# Patient Record
Sex: Female | Born: 2000 | Race: Black or African American | Hispanic: No | Marital: Single | Smoking: Never smoker
Health system: Southern US, Community
[De-identification: ages and names within clinical notes are randomized; demographics above are authoritative.]

## PROBLEM LIST (undated history)

## (undated) DIAGNOSIS — Z87898 Personal history of other specified conditions: Secondary | ICD-10-CM

## (undated) DIAGNOSIS — G919 Hydrocephalus, unspecified: Secondary | ICD-10-CM

## (undated) DIAGNOSIS — I615 Nontraumatic intracerebral hemorrhage, intraventricular: Secondary | ICD-10-CM

## (undated) DIAGNOSIS — J984 Other disorders of lung: Secondary | ICD-10-CM

## (undated) DIAGNOSIS — Z982 Presence of cerebrospinal fluid drainage device: Secondary | ICD-10-CM

## (undated) DIAGNOSIS — I639 Cerebral infarction, unspecified: Secondary | ICD-10-CM

## (undated) DIAGNOSIS — H35109 Retinopathy of prematurity, unspecified, unspecified eye: Secondary | ICD-10-CM

## (undated) HISTORY — PX: VENTRICULO-PERITONEAL SHUNT PLACEMENT / LAPAROSCOPIC INSERTION PERITONEAL CATHETER: SUR1040

## (undated) HISTORY — DX: Personal history of other specified conditions: Z87.898

## (undated) HISTORY — PX: UMBILICAL HERNIA REPAIR: SHX196

## (undated) HISTORY — PX: LAPAROSCOPIC REVISION VENTRICULAR-PERITONEAL (V-P) SHUNT: SHX5924

## (undated) HISTORY — DX: Other disorders of lung: J98.4

## (undated) HISTORY — PX: EYE SURGERY: SHX253

## (undated) HISTORY — PX: OVARY SURGERY: SHX727

---
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
## 2022-06-22 ENCOUNTER — Emergency Department (HOSPITAL_COMMUNITY): Payer: Medicaid Other

## 2022-06-22 ENCOUNTER — Encounter (HOSPITAL_COMMUNITY): Payer: Self-pay

## 2022-06-22 ENCOUNTER — Other Ambulatory Visit: Payer: Self-pay

## 2022-06-22 ENCOUNTER — Emergency Department (HOSPITAL_COMMUNITY)
Admission: EM | Admit: 2022-06-22 | Discharge: 2022-06-22 | Disposition: A | Discharge status: Home / Self Care | Payer: Medicaid Other | Attending: Emergency Medicine | Admitting: Emergency Medicine

## 2022-06-22 DIAGNOSIS — R1013 Epigastric pain: Secondary | ICD-10-CM | POA: Insufficient documentation

## 2022-06-22 DIAGNOSIS — R112 Nausea with vomiting, unspecified: Secondary | ICD-10-CM | POA: Diagnosis not present

## 2022-06-22 DIAGNOSIS — E860 Dehydration: Secondary | ICD-10-CM | POA: Diagnosis not present

## 2022-06-22 DIAGNOSIS — R519 Headache, unspecified: Secondary | ICD-10-CM

## 2022-06-22 LAB — CBC WITH DIFFERENTIAL/PLATELET
Abs Immature Granulocytes: 0.05 10*3/uL (ref 0.00–0.07)
Basophils Absolute: 0 10*3/uL (ref 0.0–0.1)
Basophils Relative: 0 %
Eosinophils Absolute: 0 10*3/uL (ref 0.0–0.5)
Eosinophils Relative: 0 %
HCT: 39.5 % (ref 36.0–46.0)
Hemoglobin: 13.6 g/dL (ref 12.0–15.0)
Immature Granulocytes: 0 %
Lymphocytes Relative: 8 %
Lymphs Abs: 1.2 10*3/uL (ref 0.7–4.0)
MCH: 29.3 pg (ref 26.0–34.0)
MCHC: 34.4 g/dL (ref 30.0–36.0)
MCV: 85.1 fL (ref 80.0–100.0)
Monocytes Absolute: 0.3 10*3/uL (ref 0.1–1.0)
Monocytes Relative: 2 %
Neutro Abs: 13.1 10*3/uL — ABNORMAL HIGH (ref 1.7–7.7)
Neutrophils Relative %: 90 %
Platelets: 234 10*3/uL (ref 150–400)
RBC: 4.64 MIL/uL (ref 3.87–5.11)
RDW: 12.9 % (ref 11.5–15.5)
WBC: 14.6 10*3/uL — ABNORMAL HIGH (ref 4.0–10.5)
nRBC: 0 % (ref 0.0–0.2)

## 2022-06-22 LAB — BASIC METABOLIC PANEL
Anion gap: 13 (ref 5–15)
BUN: 15 mg/dL (ref 6–20)
CO2: 20 mmol/L — ABNORMAL LOW (ref 22–32)
Calcium: 10.2 mg/dL (ref 8.9–10.3)
Chloride: 104 mmol/L (ref 98–111)
Creatinine, Ser: 1.02 mg/dL — ABNORMAL HIGH (ref 0.44–1.00)
GFR, Estimated: >60 mL/min (ref >60)
Glucose, Bld: 120 mg/dL — ABNORMAL HIGH (ref 70–99)
Potassium: 3.9 mmol/L (ref 3.5–5.1)
Sodium: 137 mmol/L (ref 135–145)

## 2022-06-22 LAB — I-STAT BETA HCG BLOOD, ED (MC, WL, AP ONLY): I-stat hCG, quantitative: <5 m[IU]/mL (ref <5)

## 2022-06-22 MED ORDER — ONDANSETRON HCL 4 MG/2ML IJ SOLN
4.0000 mg | Freq: Once | INTRAMUSCULAR | Status: AC
  Administered 2022-06-22: 4 mg via INTRAVENOUS
  Filled 2022-06-22: qty 2

## 2022-06-22 MED ORDER — LACTATED RINGERS IV BOLUS
1000.0000 mL | Freq: Once | INTRAVENOUS | Status: AC
  Administered 2022-06-22: 1000 mL via INTRAVENOUS

## 2022-06-22 MED ORDER — ONDANSETRON HCL 4 MG PO TABS: 4.0000 mg | ORAL_TABLET | Freq: Four times a day (QID) | ORAL | 0 refills | Status: DC

## 2022-06-22 MED ORDER — METOCLOPRAMIDE HCL 5 MG/ML IJ SOLN
10.0000 mg | Freq: Once | INTRAMUSCULAR | Status: AC
  Administered 2022-06-22: 10 mg via INTRAVENOUS
  Filled 2022-06-22: qty 2

## 2022-06-22 NOTE — ED Notes (Signed)
Pt transported to XR.  

## 2022-06-22 NOTE — ED Provider Notes (Signed)
[place] EMERGENCY DEPARTMENT Provider Note   CSN: [id number] Arrival date & time: 06/22/22  1655     History  Chief Complaint  Patient presents with   shunt malfunction    Crystal Greene is a 21 y.o. female.  Pt is a 21y/o female with history of hydrocephalus s/p shunt revision on 12/20/13 with a [employer] valve at 140 mm who has been doing well until about 2 hours prior to arrival.  She states she suddenly started feeling a sensation of pins-and-needles across her forehead, 2 episodes of vomiting and upper abdominal pain.  She has not had fever but mom reports she seems more sleepy than normal and this is the way she acted the last time she had a shunt malfunction.  Pt denies fever.  No numbness or tingling in the ext.  Pt denies any lower abd pain, dysuria or pelvic symptoms.  Pt reports feeling normal when she woke up this morning.  The history is provided by the patient, the EMS personnel and a parent.       Home Medications Prior to Admission medications   Medication Sig Start Date End Date Taking? Authorizing Provider  ondansetron (ZOFRAN) 4 MG tablet Take 1 tablet (4 mg total) by mouth every 6 (six) hours. 06/22/22  Yes Toba Claudio, Loree Fee, MD  albuterol (PROAIR HFA) 108 (90 Base) MCG/ACT inhaler Inhale 2 puffs into the lungs every 4 (four) hours as needed for wheezing or shortness of breath. Patient not taking: Reported on 11/10/2016 03/24/16   Bobbitt, Sedalia Muta, MD  cetirizine (ZYRTEC) 10 MG tablet Take 10 mg by mouth daily. as directed 06/07/16   [provider]  clindamycin-benzoyl peroxide (BENZACLIN) gel Apply topically daily. 09/25/16   Guerry Minors, MD  desonide (DESOWEN) 0.05 % ointment APPLY TO RED RASH AREAS AT [facility] TWICE DAILY AS NEEDED. 08/22/16   Bobbitt, Sedalia Muta, MD  diphenhydrAMINE (BENADRYL) 25 mg capsule Take 1 capsule (25 mg total) by mouth every 6 (six) hours as needed. 06/13/16   Jola Schmidt, MD   EPINEPHrine (EPIPEN 2-PAK) 0.3 mg/0.3 mL IJ SOAJ injection INJECT 0.3MLS INTO THE MUSCLE ONCE in case of anaphylaxis 03/24/16   Bobbitt, Sedalia Muta, MD  famotidine (PEPCID) 20 MG tablet Take 1 tablet (20 mg total) by mouth 2 (two) times daily. Patient not taking: Reported on 09/05/2016 06/13/16   Jola Schmidt, MD  levocetirizine Harlow Ohms) 5 MG tablet Take one tablet daily for runny nose or itching. 03/24/16   Bobbitt, Sedalia Muta, MD  mometasone (NASONEX) 50 MCG/ACT nasal spray Use one spray in each nostril 1-2 times daily as needed for stuffy nose or drainage. 03/24/16   Bobbitt, Sedalia Muta, MD  ranitidine (ZANTAC) 150 MG tablet TAKE ONE TABLET ONCE OR TWICE DAILY AS DIRECTED 01/01/16   Bobbitt, Sedalia Muta, MD  triamcinolone (KENALOG) 0.025 % ointment Apply to red rash areas at body twice daily as needed.  DO NOT APPLY TO FACE. 03/24/16   Bobbitt, Sedalia Muta, MD  triamcinolone ointment (KENALOG) 0.1 % Apply small amount to affected areas BID for eczema flares.  Use up to 2 weeks at a time 07/14/16   Ander Slade, NP      Allergies    Shellfish allergy, Fish allergy, and Other    Review of Systems   Review of Systems  Physical Exam Updated Vital Signs BP 132/89   Pulse 72   Temp (!) 97.3 F (36.3 C) (Oral)   Resp (!) 30   SpO2 99%  Physical Exam Vitals and nursing note reviewed.  Constitutional:      General: She is not in acute distress.    Appearance: She is well-developed.  HENT:     Head: Normocephalic and atraumatic.  Eyes:     Pupils: Pupils are equal, round, and reactive to light.  Neck:     Comments: No tenderness noted over shunt tubing Cardiovascular:     Rate and Rhythm: Normal rate and regular rhythm.     Heart sounds: Normal heart sounds. No murmur heard.    No friction rub.  Pulmonary:     Effort: Pulmonary effort is normal.     Breath sounds: Normal breath sounds. No wheezing or rales.  Abdominal:     General: Bowel sounds are normal. There is no  distension.     Palpations: Abdomen is soft.     Tenderness: There is abdominal tenderness in the epigastric area. There is no guarding or rebound.  Musculoskeletal:        General: No tenderness. Normal range of motion.     Comments: No edema  Skin:    General: Skin is warm and dry.     Findings: No rash.  Neurological:     Mental Status: She is alert.     Cranial Nerves: No cranial nerve deficit.     Comments: Pt is oriented x3.  She is awake but slightly slower to respond.  Pt is moving all ext with 5/5 strength and sensation.  No pronator drift.  Pupils are reactive.  Psychiatric:        Behavior: Behavior normal.     ED Results / Procedures / Treatments   Labs (all labs ordered are listed, but only abnormal results are displayed) Labs Reviewed  CBC WITH DIFFERENTIAL/PLATELET - Abnormal; Notable for the following components:      Result Value   WBC 14.6 (*)    Neutro Abs 13.1 (*)    All other components within normal limits  BASIC METABOLIC PANEL - Abnormal; Notable for the following components:   CO2 20 (*)    Glucose, Bld 120 (*)    Creatinine, Ser 1.02 (*)    All other components within normal limits  I-STAT BETA HCG BLOOD, ED ([facility], [facility], [facility] ONLY)    EKG None  Radiology DG Cervical Spine 1 View  Result Date: 06/22/2022 CLINICAL DATA:  Shunt malfunction EXAM: DG CERVICAL SPINE - 1 VIEW; ABDOMEN - 1 VIEW; CHEST 1 VIEW; SKULL - 1-3 VIEW COMPARISON:  None Available. FINDINGS: Left frontal ventriculostomy is in place with its tip oriented toward the medial aspect of the left lateral ventricle anteriorly. Shunt catheter tubing is seen along its left cervical, thoracic, and intra-abdominal course, accessing the abdomen within the epigastrium with its tip located within the left upper quadrant of the abdomen. The catheter is intact along its course. Lungs are clear. Cardiac size within normal limits. Pulmonary vascularity is normal. Normal abdominal gas pattern. IMPRESSION: 1.  Intact shunt catheter tubing. 2. No acute cardiopulmonary disease. 3. Normal abdominal gas pattern. Electronically Signed   By: Helyn Numbers M.D.   On: 06/22/2022 19:02   DG Skull 1-3 Views  Result Date: 06/22/2022 CLINICAL DATA:  Shunt malfunction EXAM: DG CERVICAL SPINE - 1 VIEW; ABDOMEN - 1 VIEW; CHEST 1 VIEW; SKULL - 1-3 VIEW COMPARISON:  None Available. FINDINGS: Left frontal ventriculostomy is in place with its tip oriented toward the medial aspect of the left lateral ventricle anteriorly. Shunt catheter tubing is  seen along its left cervical, thoracic, and intra-abdominal course, accessing the abdomen within the epigastrium with its tip located within the left upper quadrant of the abdomen. The catheter is intact along its course. Lungs are clear. Cardiac size within normal limits. Pulmonary vascularity is normal. Normal abdominal gas pattern. IMPRESSION: 1. Intact shunt catheter tubing. 2. No acute cardiopulmonary disease. 3. Normal abdominal gas pattern. Electronically Signed   By: Helyn Numbers M.D.   On: 06/22/2022 19:02   DG Chest 1 View  Result Date: 06/22/2022 CLINICAL DATA:  Shunt malfunction EXAM: DG CERVICAL SPINE - 1 VIEW; ABDOMEN - 1 VIEW; CHEST 1 VIEW; SKULL - 1-3 VIEW COMPARISON:  None Available. FINDINGS: Left frontal ventriculostomy is in place with its tip oriented toward the medial aspect of the left lateral ventricle anteriorly. Shunt catheter tubing is seen along its left cervical, thoracic, and intra-abdominal course, accessing the abdomen within the epigastrium with its tip located within the left upper quadrant of the abdomen. The catheter is intact along its course. Lungs are clear. Cardiac size within normal limits. Pulmonary vascularity is normal. Normal abdominal gas pattern. IMPRESSION: 1. Intact shunt catheter tubing. 2. No acute cardiopulmonary disease. 3. Normal abdominal gas pattern. Electronically Signed   By: Helyn Numbers M.D.   On: 06/22/2022 19:02   DG Abd  1 View  Result Date: 06/22/2022 CLINICAL DATA:  Shunt malfunction EXAM: DG CERVICAL SPINE - 1 VIEW; ABDOMEN - 1 VIEW; CHEST 1 VIEW; SKULL - 1-3 VIEW COMPARISON:  None Available. FINDINGS: Left frontal ventriculostomy is in place with its tip oriented toward the medial aspect of the left lateral ventricle anteriorly. Shunt catheter tubing is seen along its left cervical, thoracic, and intra-abdominal course, accessing the abdomen within the epigastrium with its tip located within the left upper quadrant of the abdomen. The catheter is intact along its course. Lungs are clear. Cardiac size within normal limits. Pulmonary vascularity is normal. Normal abdominal gas pattern. IMPRESSION: 1. Intact shunt catheter tubing. 2. No acute cardiopulmonary disease. 3. Normal abdominal gas pattern. Electronically Signed   By: Helyn Numbers M.D.   On: 06/22/2022 19:02   CT Head Wo Contrast  Result Date: 06/22/2022 CLINICAL DATA:  Headache, new or worsening neuro deficit. Patient reports sudden onset of headache nausea and abdominal pain. EXAM: CT HEAD WITHOUT CONTRAST TECHNIQUE: Contiguous axial images were obtained from the base of the skull through the vertex without intravenous contrast. RADIATION DOSE REDUCTION: This exam was performed according to the departmental dose-optimization program which includes automated exposure control, adjustment of the mA and/or kV according to patient size and/or use of iterative reconstruction technique. COMPARISON:  CT head without contrast 08/18/2014 FINDINGS: Brain: A left frontal ventriculostomy catheter is in stable position. Frontal horns are decompressed. Left lateral ventricle is decompressed. Mild prominence of the posterior horn of the right lateral ventricle noted, markedly improved from prior exam. Tip is within normal limits. No acute infarct, hemorrhage, or mass lesion is present. No significant white matter lesions are present. Deep brain nuclei are within normal limits.  The brainstem and cerebellum are within normal limits. Vascular: No hyperdense vessel or unexpected calcification. Skull: Right parietal burr hole noted. Left frontal burr hole is present catheter place. Skull is otherwise intact. No significant extracranial soft tissue lesion is present. Sinuses/Orbits: The paranasal sinuses and mastoid air cells are clear. The globes and orbits are within normal limits. IMPRESSION: 1. Stable position of left frontal ventriculostomy catheter. 2. Mild prominence of the posterior horn of  the right lateral ventricle, markedly improved from prior exam. This likely reflects patient's baseline without the parietal ventriculostomy catheter in place. Temporal tip is not dilated to suggest focal hydro. 3. No other acute intracranial abnormality. Electronically Signed   By: Marin Roberts M.D.   On: 06/22/2022 18:04    Procedures Procedures    Medications Ordered in ED Medications  metoCLOPramide (REGLAN) injection 10 mg (10 mg Intravenous Given 06/22/22 1807)  lactated ringers bolus 1,000 mL (0 mLs Intravenous Stopped 06/22/22 2112)    ED Course/ Medical Decision Making/ A&P                           Medical Decision Making Amount and/or Complexity of Data Reviewed Independent Historian: EMS External Data Reviewed: notes.    Details: [facility] Labs: ordered. Decision-making details documented in ED Course. Radiology: ordered and independent interpretation performed. Decision-making details documented in ED Course.  Risk Prescription drug management.   Pt with multiple medical problems and comorbidities and presenting today with a complaint that caries a high risk for morbidity and mortality.  Here today with sudden onset headache, vomiting and upper abdominal discomfort.  Patient was fine earlier today.  Patient is still lucid, answering questions appropriately but appears like she does not feel good.  She is slightly slower on response but no other  focal neurologic findings.  Concern for possible shunt malfunction and recurrent hydrocephalus versus shunt fracture.  Patient's last shunt was revised in 2015.  She reports no issues with it since that time.  Also concern for possible viral etiology, URI or subarachnoid hemorrhage as patient had sudden onset of symptoms.  She was given Reglan for headache and nausea.  Shunt series and head CT are pending.  Vital signs are pulse of 57 and blood pressure of 114 Systolic. 9:39 PM I have independently visualized and interpreted pt's images today. CT of the head with shunt in place and ventricle looks similar to prior.  Shunt series showed no sign of tubing breakage.  Radiology reports intact shunt catheter tubing in all views and stable position of left frontal ventriculostomy catheter.  There is mild prominence of the posterior horn of the right lateral ventricle markedly improved from prior exams and most likely at her baseline.  I independently interpreted patient's labs today and she has a leukocytosis of 14 but normal hemoglobin and platelet count, hCG is negative and BMP with mild AKI with creatinine of 1 today from her baseline of 0.7.  On repeat evaluation after Reglan patient reports her headache is much better and her nausea is improved.  Patient will receive 1 L of fluid and will ensure she can p.o. challenge.  Patient's family including her mom is at bedside.  Findings discussed with the patient and her family.  They are relieved that the shunt is functioning appropriately.  9:39 PM Patient tolerated p.o. challenge without difficulty.  She is ready for discharge home.        Final Clinical Impression(s) / ED Diagnoses Final diagnoses:  Acute intractable headache, unspecified headache type  Nausea and vomiting, unspecified vomiting type  Dehydration    Rx / DC Orders ED Discharge Orders          Ordered    ondansetron (ZOFRAN) 4 MG tablet  Every 6 hours        06/22/22 2138               Gwyneth Sprout, MD  06/22/22 2139  

## 2022-06-22 NOTE — ED Triage Notes (Signed)
Pt BIB [facility] from home after sudden onset of headache, nausea, and abd pain. Left VP shunt placed at [facility] as a child. Previous malfunction caused similar symptoms so family called. VSS.

## 2022-06-22 NOTE — Discharge Instructions (Signed)
Everything with your shunt looks normal.  You may have caught a bug.  You may have nausea over the next day or 2 and were given a prescription for nausea medicine.  However if you start running high fevers, the nausea is not controlled with the medication you start having any new weakness numbness, visual changes or confusion return to the emergency room.

## 2022-06-22 NOTE — ED Notes (Signed)
Patient drank apple juice with no nausea or vomiting. Patient resting at this time and fluids are still running.

## 2022-12-26 ENCOUNTER — Ambulatory Visit (INDEPENDENT_AMBULATORY_CARE_PROVIDER_SITE_OTHER): Payer: Medicaid Other

## 2022-12-26 VITALS — BP 121/74 | HR 74 | Ht 60.0 in | Wt 109.6 lb

## 2022-12-26 DIAGNOSIS — Z3009 Encounter for other general counseling and advice on contraception: Secondary | ICD-10-CM

## 2022-12-26 MED ORDER — IBUPROFEN 600 MG PO TABS: 600.0000 mg | ORAL_TABLET | Freq: Four times a day (QID) | ORAL | 0 refills | Status: AC | PRN

## 2022-12-26 MED ORDER — DIAZEPAM 5 MG PO TABS: 5.0000 mg | ORAL_TABLET | Freq: Once | ORAL | 0 refills | Status: AC

## 2022-12-26 MED ORDER — MISOPROSTOL 200 MCG PO TABS: ORAL_TABLET | ORAL | 0 refills | Status: DC

## 2022-12-26 NOTE — Progress Notes (Unsigned)
Pt presents for [facility] consult. Pt considering paragard. Unsure if she wants [facility] today.

## 2022-12-26 NOTE — Progress Notes (Unsigned)
   GYNECOLOGY OFFICE VISIT NOTE: BC CONSULT  History:  Crystal Greene is a 22 y.o. G0P0000 here today for [facility] consult.  atient states she is being instructed to start [facility] method by her mother, who is not present today.  She is not currently sexually active, but has been in the past. She endorses condom usage, but states "it was only one time in 2023."   She reports normal periods, but is unsure of duration or flow. She endorses "painful" cramping that causes "my back and stomach to be on fire."   She denies any abnormal vaginal discharge, bleeding, pelvic pain or other concerns.   Past Medical History:  Diagnosis Date   Chronic lung disease    H/O prematurity 05/19/2013   24 wks PPROM and vaginal delivery; originally twin gestation with pregnancy complicated by a single fetal demise.     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    Stroke [facility])     Past Surgical History:  Procedure Laterality Date   EYE SURGERY     LAPAROSCOPIC REVISION VENTRICULAR-PERITONEAL (V-P) SHUNT     OVARY SURGERY     UMBILICAL HERNIA REPAIR     VENTRICULO-PERITONEAL SHUNT PLACEMENT / LAPAROSCOPIC INSERTION PERITONEAL CATHETER      The following portions of the patient's history were reviewed and updated as appropriate: allergies, current medications, past family history, past medical history, past social history, past surgical history and problem list.   Health Maintenance:  No pap on file d/t age.  No mammogram on file d/t age.   Review of Systems:  Genito-Urinary ROS: no dysuria, trouble voiding, or hematuria Gastrointestinal ROS: negative Objective:  Vitals: Ht 5' (1.524 m)   Wt 109 lb 9.6 oz (49.7 kg)   LMP 12/23/2022   BMI 21.40 kg/m   Physical Exam: Physical Exam Constitutional:      Appearance: Normal appearance.  HENT:     Head: Normocephalic and atraumatic.  Eyes:     Conjunctiva/sclera:  Conjunctivae normal.  Cardiovascular:     Rate and Rhythm: Normal rate.  Musculoskeletal:        General: Normal range of motion.     Cervical back: Normal range of motion.  Neurological:     Mental Status: She is alert and oriented to person, place, and time.  Psychiatric:        Mood and Affect: Mood normal.        Behavior: Behavior normal.      Labs and Imaging: No results found.  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 -Extensively reviewed potential side effects of Paragard placement including but not limited to worsening cramps and heavier flow or duration. -Patient further informed that placement would not occur today as would recommend pretreatment with cervical ripening agent and medications. -Patient verbalizes understanding and states she would like to proceed with placement. -Plan to RTO in 2-3 weeks. -Rx for cytotec, valium, and ibuprofen sent to pharmacy on file.    Total face-to-face time with patient: 15 minutes   Gerrit Heck, [place] 12/26/2022 10:46 AM

## 2023-01-15 ENCOUNTER — Ambulatory Visit (INDEPENDENT_AMBULATORY_CARE_PROVIDER_SITE_OTHER): Payer: Medicaid Other

## 2023-01-15 VITALS — BP 106/74 | HR 110 | Ht 60.0 in | Wt 106.2 lb

## 2023-01-15 DIAGNOSIS — Z3009 Encounter for other general counseling and advice on contraception: Secondary | ICD-10-CM

## 2023-01-15 NOTE — Progress Notes (Signed)
Patient reports she did not take pre-procedural medication and would like to reschedule.  Instructed to send provider mychart message if medications not available.  Further instructed on administration of all medications the night before and day of procedure; inserted in patient phone per her request. Given informational phamplet for Mirena IUD.  Scheduled for Thursday May 23 at 1030 with this provider.  Cherre Robins MSN, CNM Advanced Practice Provider, [facility] [employer]

## 2023-01-15 NOTE — Progress Notes (Signed)
Pt presents for IUD insertion. Pt did not pick up Cytotec. Wants to try to do insertion today.

## 2023-01-22 ENCOUNTER — Ambulatory Visit (INDEPENDENT_AMBULATORY_CARE_PROVIDER_SITE_OTHER): Payer: Medicaid Other

## 2023-01-22 VITALS — BP 116/73 | HR 82 | Ht 60.0 in | Wt 105.3 lb

## 2023-01-22 DIAGNOSIS — Z30014 Encounter for initial prescription of intrauterine contraceptive device: Secondary | ICD-10-CM

## 2023-01-22 MED ORDER — PARAGARD INTRAUTERINE COPPER IU IUD
1.0000 | INTRAUTERINE_SYSTEM | Freq: Once | INTRAUTERINE | Status: AC
  Administered 2023-01-22: 1 via INTRAUTERINE

## 2023-01-22 NOTE — Addendum Note (Signed)
Addended by: Jearld Adjutant on: 01/22/2023 03:57 PM   Modules accepted: Orders

## 2023-01-22 NOTE — Progress Notes (Signed)
    GYNECOLOGY OFFICE PROCEDURE NOTE  Chenika Crysler is a 22 y.o. G0P0000 here for Paragard IUD insertion. No GYN concerns.  No pap smear was on file d/t age. Patient premedicated with of cytotec vaginal, ibuprofen, and valium.  Reports feeling calm and would like to proceed with insertion.   IUD Insertion Procedure Note IUD: Paragard  Exp: July 2029  Lot: See Mar  Patient identified, informed consent performed, consent signed.   Discussed risks of irregular bleeding, cramping, infection, malpositioning or misplacement of the IUD outside the uterus which may require further procedure such as laparoscopy. Time out was performed.  Urine pregnancy test negative.  Bimanual exam performed attempted, but patient reports discomfort. Short Pederson speculum placed in the vagina and cervix visualized.  Cervix and vaginal walls cleaned x 3 with betadine solution. Anterior aspect grasped with a single tooth tenaculum.  Uterus sounded to 7 cm.  Paragard IUD placed per manufacturer's recommendations via sterile procedure.  Attempting to trim strings and scissors snags string resulting in IUD expulsion. Sterile field maintained and IUD reloaded and reinserted without issues.   Strings trimmed to ~3 cm. Tenaculum was removed, good hemostasis noted. One Cytotec tablet noted in posterior fornix and removed with faux swab.  Patient tolerated procedure well.   Patient was given post-procedure instructions.  She was advised to have backup contraception for one week.  Patient was instructed to check IUD strings after heavy menses. Patient also instructed to follow up in 4 weeks for IUD check and call and/or report any issues prior to next visit.  Cherre Robins, CNM 01/22/2023

## 2023-01-29 ENCOUNTER — Encounter: Payer: Self-pay | Admitting: Family Medicine

## 2023-01-29 ENCOUNTER — Ambulatory Visit (INDEPENDENT_AMBULATORY_CARE_PROVIDER_SITE_OTHER): Payer: Medicaid Other | Admitting: Family Medicine

## 2023-01-29 VITALS — BP 90/62 | HR 85 | Temp 97.8°F | Ht 60.0 in | Wt 108.4 lb

## 2023-01-29 DIAGNOSIS — J4520 Mild intermittent asthma, uncomplicated: Secondary | ICD-10-CM

## 2023-01-29 DIAGNOSIS — T7800XA Anaphylactic reaction due to unspecified food, initial encounter: Secondary | ICD-10-CM

## 2023-01-29 NOTE — Progress Notes (Signed)
New Patient Office Visit  Subjective    Patient ID: Crystal Greene, female    DOB: 18-Sep-2000  Age: 22 y.o. MRN: [id number]  CC:  Chief Complaint  Patient presents with   Establish Care    HPI Crystal Greene presents to establish care Patient reports no new symptoms or issues today, patient reports a history of hydrocephalus in the past, sees Dr. Marice Potter at [facility] for this. States that she does have a history of headaches in the past, states that she has a history of prematurity and IVH, has a history of having a shunt placed as well.   Patient has a history of asthma in the past, doesn't really use her inhaler much, only if her allergies are bothering her. Also takes allergy medication for her allergies. Patient is allergic to shellfish, seafood, tree nuts. States she carries an epipen with her, hasn't had to use any in several years.   Patient did have an IUD placed, however no pap smear. She reports that she will not have a pap smear, does not want to have it done.   I have reviewed all aspects of the patient's medical history including social, family, and surgical history.  Current Outpatient Medications  Medication Instructions   albuterol (PROAIR HFA) 108 (90 Base) MCG/ACT inhaler 2 puffs, Inhalation, Every 4 hours PRN   EPINEPHrine (EPIPEN 2-PAK) 0.3 mg/0.3 mL IJ SOAJ injection INJECT 0.3MLS INTO THE MUSCLE ONCE in case of anaphylaxis   ibuprofen (ADVIL) 600 mg, Oral, Every 6 hours PRN    Past Medical History:  Diagnosis Date   Chronic lung disease    H/O prematurity 05/19/2013   24 wks PPROM and vaginal delivery; originally twin gestation with pregnancy complicated by a single fetal demise.     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    Stroke [facility])     Past Surgical History:  Procedure Laterality Date   EYE SURGERY     LAPAROSCOPIC REVISION VENTRICULAR-PERITONEAL (V-P) SHUNT      OVARY SURGERY     UMBILICAL HERNIA REPAIR     VENTRICULO-PERITONEAL SHUNT PLACEMENT / LAPAROSCOPIC INSERTION PERITONEAL CATHETER      Family History  Problem Relation Age of Onset   Other Mother        Alpha Thalassemia   Asthma Mother    Pneumonia Maternal Grandfather        died at age 15   High blood pressure Paternal Grandmother    Heart Problems Paternal Grandmother    Bleeding Disorder Paternal Grandmother    Heart attack Paternal Grandmother     Social History   Socioeconomic History   Marital status: Single    Spouse name: Not on file   Number of children: Not on file   Years of education: Not on file   Highest education level: Not on file  Occupational History   Not on file  Tobacco Use   Smoking status: Never   Smokeless tobacco: Never  Vaping Use   Vaping Use: Some days  Substance and Sexual Activity   Alcohol use: Not Currently   Drug use: Not Currently    Types: Marijuana   Sexual activity: Not Currently  Other Topics Concern   Not on file  Social History Narrative   Not on file   Social Determinants of Health   Financial Resource Strain: Not on file  Food  Insecurity: Not on file  Transportation Needs: Not on file  Physical Activity: Not on file  Stress: Not on file  Social Connections: Not on file  Intimate Partner Violence: Not on file    Review of Systems  All other systems reviewed and are negative.       Objective    BP 90/62 (BP Location: Right Arm, Patient Position: Sitting, Cuff Size: Normal)   Pulse 85   Temp 97.8 F (36.6 C) (Oral)   Ht 5' (1.524 m)   Wt 108 lb 6.4 oz (49.2 kg)   LMP  (LMP Unknown)   SpO2 97%   BMI 21.17 kg/m   Physical Exam Vitals reviewed.  Constitutional:      Appearance: Normal appearance. She is well-groomed and normal weight.  Eyes:     Conjunctiva/sclera: Conjunctivae normal.  Neck:     Thyroid: No thyromegaly.  Cardiovascular:     Rate and Rhythm: Normal rate and regular rhythm.      Pulses: Normal pulses.     Heart sounds: S1 normal and S2 normal.  Pulmonary:     Effort: Pulmonary effort is normal.     Breath sounds: Normal breath sounds and air entry.  Abdominal:     General: Bowel sounds are normal.  Musculoskeletal:     Right lower leg: No edema.     Left lower leg: No edema.  Neurological:     Mental Status: She is alert and oriented to person, place, and time. Mental status is at baseline.     Gait: Gait is intact.  Psychiatric:        Mood and Affect: Mood and affect normal.        Speech: Speech normal.        Behavior: Behavior normal.        Judgment: Judgment normal.         Assessment & Plan:  Mild intermittent asthma without complication Assessment & Plan: Lungs clear on exam, has albuterol rescue inhaler at home, she will continue 2 puffs every 6 hours as needed for wheezing.    Food allergy Assessment & Plan: Pt has epipens available at home, she will continue to avoid food triggers, use her epiens PRN allergic reaction. She will let me know when she needs a refill on her pens.    I spent 30 minutes with the patient today reviewing all of her history, discussing pap smear with her however she declines to have this done.   Return in about 1 year (around 01/29/2024) for annual physical exam.   Karie Georges, MD

## 2023-02-02 NOTE — Assessment & Plan Note (Signed)
Pt has epipens available at home, she will continue to avoid food triggers, use her epiens PRN allergic reaction. She will let me know when she needs a refill on her pens.

## 2023-02-02 NOTE — Assessment & Plan Note (Signed)
Lungs clear on exam, has albuterol rescue inhaler at home, she will continue 2 puffs every 6 hours as needed for wheezing.

## 2023-02-23 ENCOUNTER — Ambulatory Visit: Payer: Medicaid Other | Admitting: Advanced Practice Midwife

## 2023-02-25 ENCOUNTER — Ambulatory Visit: Payer: Medicaid Other | Admitting: Obstetrics and Gynecology

## 2024-08-09 ENCOUNTER — Ambulatory Visit: Admitting: Family Medicine

## 2024-08-09 ENCOUNTER — Encounter: Payer: Self-pay | Admitting: Family Medicine

## 2024-08-09 VITALS — BP 90/52 | HR 95 | Temp 98.4°F | Ht 60.0 in | Wt 107.7 lb

## 2024-08-09 DIAGNOSIS — T7800XD Anaphylactic reaction due to unspecified food, subsequent encounter: Secondary | ICD-10-CM

## 2024-08-09 DIAGNOSIS — Z982 Presence of cerebrospinal fluid drainage device: Secondary | ICD-10-CM

## 2024-08-09 DIAGNOSIS — K439 Ventral hernia without obstruction or gangrene: Secondary | ICD-10-CM

## 2024-08-09 DIAGNOSIS — R1085 Abdominal pain of multiple sites: Secondary | ICD-10-CM

## 2024-08-09 LAB — COMPREHENSIVE METABOLIC PANEL WITH GFR
ALT: 13 U/L (ref 0–35)
AST: 13 U/L (ref 0–37)
Albumin: 4.3 g/dL (ref 3.5–5.2)
Alkaline Phosphatase: 93 U/L (ref 39–117)
BUN: 9 mg/dL (ref 6–23)
CO2: 25 meq/L (ref 19–32)
Calcium: 9.6 mg/dL (ref 8.4–10.5)
Chloride: 101 meq/L (ref 96–112)
Creatinine, Ser: 0.67 mg/dL (ref 0.40–1.20)
GFR: 123.96 mL/min (ref >60.00)
Glucose, Bld: 74 mg/dL (ref 70–99)
Potassium: 4.1 meq/L (ref 3.5–5.1)
Sodium: 136 meq/L (ref 135–145)
Total Bilirubin: 0.5 mg/dL (ref 0.2–1.2)
Total Protein: 7.6 g/dL (ref 6.0–8.3)

## 2024-08-09 LAB — CBC WITH DIFFERENTIAL/PLATELET
Basophils Absolute: 0 K/uL (ref 0.0–0.1)
Basophils Relative: 0.2 % (ref 0.0–3.0)
Eosinophils Absolute: 0 K/uL (ref 0.0–0.7)
Eosinophils Relative: 0.3 % (ref 0.0–5.0)
HCT: 36.6 % (ref 36.0–46.0)
Hemoglobin: 12.1 g/dL (ref 12.0–15.0)
Lymphocytes Relative: 24.6 % (ref 12.0–46.0)
Lymphs Abs: 2.6 K/uL (ref 0.7–4.0)
MCHC: 33.1 g/dL (ref 30.0–36.0)
MCV: 83.2 fl (ref 78.0–100.0)
Monocytes Absolute: 0.7 K/uL (ref 0.1–1.0)
Monocytes Relative: 6.9 % (ref 3.0–12.0)
Neutro Abs: 7.2 K/uL (ref 1.4–7.7)
Neutrophils Relative %: 68 % (ref 43.0–77.0)
Platelets: 406 K/uL — ABNORMAL HIGH (ref 150.0–400.0)
RBC: 4.4 Mil/uL (ref 3.87–5.11)
RDW: 14 % (ref 11.5–15.5)
WBC: 10.6 K/uL — ABNORMAL HIGH (ref 4.0–10.5)

## 2024-08-09 MED ORDER — EPINEPHRINE 0.3 MG/0.3ML IJ SOAJ: INTRAMUSCULAR | 0 refills | Status: AC

## 2024-08-09 NOTE — Progress Notes (Signed)
 Established Patient Office Visit  Subjective   Patient ID: Crystal Greene, female    DOB: [birth year]  Age: 23 y.o. MRN: [id number]  Chief Complaint  Patient presents with   Abdominal Pain    X10 days, tried OTC medications with relief   Constipation    Abdominal Pain Associated symptoms include constipation. Pertinent negatives include no fever.  Constipation Associated symptoms include abdominal pain. Pertinent negatives include no fever.   Discussed the use of AI scribe software for clinical note transcription with the patient, who gave verbal consent to proceed.  History of Present Illness   Crystal Greene is a 23 year old female with a ventriculoperitoneal shunt who presents with abdominal pain.  She reports abdominal pain since Thanksgiving that began in the upper abdomen and chest as a throbbing, burning pain, then spread to her back and lower abdomen and now involves the entire abdomen and back. She feels an urge to vomit and have a bowel movement but cannot. She denies fever or chills.  Her mother gave her a sour cherry-tasting liquid that increased her bowel movements and gave partial relief, but the pain persisted. She has not had a bowel movement since that initial effect.  She has a ventriculoperitoneal shunt placed in childhood and has noticed a new hard lump in her abdomen that she and her mother had not felt before.  The pain makes it difficult for her to lie down because her whole stomach hurts. Her last menstrual period was in November, and she has been sexually active recently.  She is visiting from [place]  and plans to stay until after Christmas, which may limit access to her usual care team.       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  Gastrointestinal:  Positive for abdominal pain and constipation.      Objective:     BP (!) 90/52   Pulse 95   Temp 98.4 F (36.9 C) (Oral)   Ht 5' (1.524 m)   Wt 107 lb 11.2 oz (48.9 kg)   LMP 07/16/2024 (Exact Date)   SpO2 98%   BMI 21.03 kg/m    Physical Exam Vitals reviewed.  Constitutional:      Appearance: She is well-developed and normal weight.  Cardiovascular:     Rate and Rhythm: Normal rate and regular rhythm.     Heart sounds: Normal heart sounds. No murmur heard. Pulmonary:     Effort: Pulmonary effort is normal.     Breath sounds: Normal breath sounds.  Abdominal:     General: A surgical scar is present. Bowel sounds are normal. There is distension.     Palpations:  Abdomen is soft.     Tenderness: There is abdominal tenderness (there is a hard nodular area just superior to the surgical scar on the left of midline of the patient's abdomen, it is exquisitely tender to palpation and not reducible).     Comments: The tube of her VP shunt is palpable under the skin in her epigastric area and chest.   Neurological:     Mental Status: She is alert.       No results found for any visits on 08/09/24.    The ASCVD Risk score (Arnett DK, et al., 2019) failed to calculate for the following reasons:   The 2019  ASCVD risk score is only valid for ages 88 to 8   Risk score cannot be calculated because patient has a medical history suggesting prior/existing ASCVD    Assessment & Plan:  Abdominal pain of multiple sites -     Comprehensive metabolic panel with GFR; Future -     CBC with Differential/Platelet; Future -     CT ABDOMEN PELVIS WO CONTRAST; Future -     hCG, serum, qualitative; Future  Allergy with anaphylaxis due to food, subsequent encounter -     EPINEPHrine ; INJECT 0.3MLS INTO THE MUSCLE ONCE in case of anaphylaxis  Dispense: 2 each; Refill: 0  Ventral hernia without obstruction or gangrene -     CT ABDOMEN PELVIS WO CONTRAST; Future  VP (ventriculoperitoneal) shunt status -     CT ABDOMEN PELVIS WO CONTRAST; Future   Assessment and Plan    Ventral hernia with abdominal pain Abdominal pain since Thanksgiving, initially in the upper abdomen, spreading to the entire abdomen and back. Described as throbbing and fiery. Associated with constipation and difficulty with bowel movements. Tenderness in the abdomen, particularly in the area of previous surgery for VP shunt. Possible ventral hernia due to straining during bowel movements. Differential includes hernia and shunt malfunction. Pain did not resolve after bowel movements, raising concern for hernia or shunt issues. - Ordered CT scan of the abdomen to evaluate the hernia and VP shunt. - Ordered blood work including liver function tests, kidney function tests, and white blood cell count. - Performed urine pregnancy test to rule out pregnancy as a cause of symptoms.  VP shunt status VP shunt in place since childhood. No recent follow-up with neurosurgeon. No symptoms suggestive of shunt malfunction, but shunt palpable in the chest area. Need to ensure shunt is functioning properly due to abdominal pain and history. - Ordered CT scan to assess the VP shunt for any malfunction. - Will consider referral to a surgeon if CT scan indicates  shunt issues.        No follow-ups on file.    Heron CHRISTELLA Sharper, MD

## 2024-08-10 ENCOUNTER — Ambulatory Visit: Payer: Self-pay | Admitting: Family Medicine

## 2024-08-10 LAB — HCG, SERUM, QUALITATIVE: Preg, Serum: NEGATIVE

## 2024-08-17 ENCOUNTER — Inpatient Hospital Stay: Admission: RE | Admit: 2024-08-17 | Discharge: 2024-08-17 | Attending: Family Medicine

## 2024-08-17 DIAGNOSIS — Z982 Presence of cerebrospinal fluid drainage device: Secondary | ICD-10-CM

## 2024-08-17 DIAGNOSIS — K439 Ventral hernia without obstruction or gangrene: Secondary | ICD-10-CM

## 2024-08-17 DIAGNOSIS — R1085 Abdominal pain of multiple sites: Secondary | ICD-10-CM

## 2024-08-18 ENCOUNTER — Telehealth: Payer: Self-pay

## 2024-08-18 NOTE — Telephone Encounter (Signed)
 From CRM:  Reason for CRM: Doyal from [facility] called requesting to speak with someone regarding patient Crystal Greene and the CT pelvis report completed on 12/17. Doyal is requesting a callback at [phone]

## 2024-08-18 NOTE — Telephone Encounter (Signed)
 Noted

## 2024-08-18 NOTE — Telephone Encounter (Signed)
 Called number back and was notified, action already taken on the CT results. Ok to close

## 2024-08-30 ENCOUNTER — Inpatient Hospital Stay
Admission: RE | Admit: 2024-08-30 | Discharge: 2024-08-30 | Disposition: A | Source: Ambulatory Visit | Attending: Family Medicine

## 2024-08-30 DIAGNOSIS — Z982 Presence of cerebrospinal fluid drainage device: Secondary | ICD-10-CM

## 2024-08-30 DIAGNOSIS — K439 Ventral hernia without obstruction or gangrene: Secondary | ICD-10-CM

## 2024-08-30 DIAGNOSIS — R1085 Abdominal pain of multiple sites: Secondary | ICD-10-CM

## 2024-08-30 MED ORDER — IOPAMIDOL (ISOVUE-300) INJECTION 61%
80.0000 mL | Freq: Once | INTRAVENOUS | Status: AC | PRN
  Administered 2024-08-30: 80 mL via INTRAVENOUS

## 2024-09-02 ENCOUNTER — Encounter (HOSPITAL_BASED_OUTPATIENT_CLINIC_OR_DEPARTMENT_OTHER): Payer: Self-pay

## 2024-09-02 ENCOUNTER — Other Ambulatory Visit: Payer: Self-pay

## 2024-09-02 ENCOUNTER — Inpatient Hospital Stay (HOSPITAL_BASED_OUTPATIENT_CLINIC_OR_DEPARTMENT_OTHER)
Admission: EM | Admit: 2024-09-02 | Discharge: 2024-09-07 | DRG: 689 | Attending: Internal Medicine | Admitting: Internal Medicine

## 2024-09-02 DIAGNOSIS — N3000 Acute cystitis without hematuria: Principal | ICD-10-CM | POA: Diagnosis present

## 2024-09-02 DIAGNOSIS — N838 Other noninflammatory disorders of ovary, fallopian tube and broad ligament: Secondary | ICD-10-CM

## 2024-09-02 DIAGNOSIS — K5900 Constipation, unspecified: Secondary | ICD-10-CM | POA: Diagnosis present

## 2024-09-02 DIAGNOSIS — Z982 Presence of cerebrospinal fluid drainage device: Secondary | ICD-10-CM

## 2024-09-02 DIAGNOSIS — F17290 Nicotine dependence, other tobacco product, uncomplicated: Secondary | ICD-10-CM | POA: Diagnosis present

## 2024-09-02 DIAGNOSIS — E860 Dehydration: Secondary | ICD-10-CM | POA: Diagnosis present

## 2024-09-02 DIAGNOSIS — K56600 Partial intestinal obstruction, unspecified as to cause: Secondary | ICD-10-CM | POA: Diagnosis present

## 2024-09-02 DIAGNOSIS — N83202 Unspecified ovarian cyst, left side: Secondary | ICD-10-CM | POA: Diagnosis present

## 2024-09-02 DIAGNOSIS — Z751 Person awaiting admission to adequate facility elsewhere: Secondary | ICD-10-CM

## 2024-09-02 DIAGNOSIS — K659 Peritonitis, unspecified: Secondary | ICD-10-CM | POA: Diagnosis present

## 2024-09-02 DIAGNOSIS — Z832 Family history of diseases of the blood and blood-forming organs and certain disorders involving the immune mechanism: Secondary | ICD-10-CM

## 2024-09-02 DIAGNOSIS — Y762 Prosthetic and other implants, materials and accessory obstetric and gynecological devices associated with adverse incidents: Secondary | ICD-10-CM | POA: Diagnosis present

## 2024-09-02 DIAGNOSIS — N949 Unspecified condition associated with female genital organs and menstrual cycle: Secondary | ICD-10-CM | POA: Diagnosis present

## 2024-09-02 DIAGNOSIS — N390 Urinary tract infection, site not specified: Secondary | ICD-10-CM | POA: Diagnosis present

## 2024-09-02 DIAGNOSIS — Z8673 Personal history of transient ischemic attack (TIA), and cerebral infarction without residual deficits: Secondary | ICD-10-CM

## 2024-09-02 DIAGNOSIS — Z91013 Allergy to seafood: Secondary | ICD-10-CM

## 2024-09-02 DIAGNOSIS — Z8249 Family history of ischemic heart disease and other diseases of the circulatory system: Secondary | ICD-10-CM

## 2024-09-02 DIAGNOSIS — Z87440 Personal history of urinary (tract) infections: Secondary | ICD-10-CM

## 2024-09-02 DIAGNOSIS — K567 Ileus, unspecified: Secondary | ICD-10-CM | POA: Diagnosis present

## 2024-09-02 DIAGNOSIS — B9620 Unspecified Escherichia coli [E. coli] as the cause of diseases classified elsewhere: Secondary | ICD-10-CM | POA: Diagnosis present

## 2024-09-02 DIAGNOSIS — R188 Other ascites: Principal | ICD-10-CM | POA: Diagnosis present

## 2024-09-02 DIAGNOSIS — T8332XA Displacement of intrauterine contraceptive device, initial encounter: Secondary | ICD-10-CM | POA: Diagnosis present

## 2024-09-02 DIAGNOSIS — N809 Endometriosis, unspecified: Secondary | ICD-10-CM | POA: Diagnosis present

## 2024-09-02 DIAGNOSIS — Z825 Family history of asthma and other chronic lower respiratory diseases: Secondary | ICD-10-CM

## 2024-09-02 DIAGNOSIS — G919 Hydrocephalus, unspecified: Secondary | ICD-10-CM | POA: Diagnosis present

## 2024-09-02 LAB — COMPREHENSIVE METABOLIC PANEL WITH GFR
ALT: 9 U/L (ref 0–44)
AST: 11 U/L — ABNORMAL LOW (ref 15–41)
Albumin: 3.5 g/dL (ref 3.5–5.0)
Alkaline Phosphatase: 108 U/L (ref 38–126)
Anion gap: 15 (ref 5–15)
BUN: 16 mg/dL (ref 6–20)
CO2: 24 mmol/L (ref 22–32)
Calcium: 9.8 mg/dL (ref 8.9–10.3)
Chloride: 97 mmol/L — ABNORMAL LOW (ref 98–111)
Creatinine, Ser: 0.67 mg/dL (ref 0.44–1.00)
GFR, Estimated: >60 mL/min
Glucose, Bld: 83 mg/dL (ref 70–99)
Potassium: 3.6 mmol/L (ref 3.5–5.1)
Sodium: 136 mmol/L (ref 135–145)
Total Bilirubin: 0.4 mg/dL (ref 0.0–1.2)
Total Protein: 8 g/dL (ref 6.5–8.1)

## 2024-09-02 LAB — LIPASE, BLOOD: Lipase: 28 U/L (ref 11–51)

## 2024-09-02 LAB — CBC
HCT: 31.3 % — ABNORMAL LOW (ref 36.0–46.0)
Hemoglobin: 10.3 g/dL — ABNORMAL LOW (ref 12.0–15.0)
MCH: 26 pg (ref 26.0–34.0)
MCHC: 32.9 g/dL (ref 30.0–36.0)
MCV: 79 fL — ABNORMAL LOW (ref 80.0–100.0)
Platelets: 586 K/uL — ABNORMAL HIGH (ref 150–400)
RBC: 3.96 MIL/uL (ref 3.87–5.11)
RDW: 14.8 % (ref 11.5–15.5)
WBC: 11.9 K/uL — ABNORMAL HIGH (ref 4.0–10.5)
nRBC: 0 % (ref 0.0–0.2)

## 2024-09-02 NOTE — ED Triage Notes (Signed)
 Complaining of pain in the abdomen in the upper and lower quadrants. Started on thanksgiving. And is getting worse. Has had imaging done at [facility] imaging which she has not gotten the results of.

## 2024-09-03 ENCOUNTER — Encounter (HOSPITAL_BASED_OUTPATIENT_CLINIC_OR_DEPARTMENT_OTHER): Payer: Self-pay | Admitting: Internal Medicine

## 2024-09-03 ENCOUNTER — Inpatient Hospital Stay (HOSPITAL_COMMUNITY)

## 2024-09-03 ENCOUNTER — Emergency Department (HOSPITAL_BASED_OUTPATIENT_CLINIC_OR_DEPARTMENT_OTHER)

## 2024-09-03 DIAGNOSIS — N80122 Deep endometriosis of left ovary: Secondary | ICD-10-CM | POA: Diagnosis not present

## 2024-09-03 DIAGNOSIS — Y762 Prosthetic and other implants, materials and accessory obstetric and gynecological devices associated with adverse incidents: Secondary | ICD-10-CM | POA: Diagnosis present

## 2024-09-03 DIAGNOSIS — Z751 Person awaiting admission to adequate facility elsewhere: Secondary | ICD-10-CM | POA: Diagnosis not present

## 2024-09-03 DIAGNOSIS — Z87440 Personal history of urinary (tract) infections: Secondary | ICD-10-CM | POA: Diagnosis not present

## 2024-09-03 DIAGNOSIS — Z825 Family history of asthma and other chronic lower respiratory diseases: Secondary | ICD-10-CM | POA: Diagnosis not present

## 2024-09-03 DIAGNOSIS — N390 Urinary tract infection, site not specified: Secondary | ICD-10-CM | POA: Diagnosis not present

## 2024-09-03 DIAGNOSIS — K567 Ileus, unspecified: Secondary | ICD-10-CM | POA: Diagnosis present

## 2024-09-03 DIAGNOSIS — N83202 Unspecified ovarian cyst, left side: Secondary | ICD-10-CM | POA: Diagnosis present

## 2024-09-03 DIAGNOSIS — N949 Unspecified condition associated with female genital organs and menstrual cycle: Secondary | ICD-10-CM | POA: Diagnosis present

## 2024-09-03 DIAGNOSIS — Z982 Presence of cerebrospinal fluid drainage device: Secondary | ICD-10-CM | POA: Diagnosis not present

## 2024-09-03 DIAGNOSIS — R1084 Generalized abdominal pain: Secondary | ICD-10-CM | POA: Diagnosis not present

## 2024-09-03 DIAGNOSIS — N3000 Acute cystitis without hematuria: Secondary | ICD-10-CM | POA: Diagnosis present

## 2024-09-03 DIAGNOSIS — Z8249 Family history of ischemic heart disease and other diseases of the circulatory system: Secondary | ICD-10-CM | POA: Diagnosis not present

## 2024-09-03 DIAGNOSIS — E860 Dehydration: Secondary | ICD-10-CM | POA: Diagnosis present

## 2024-09-03 DIAGNOSIS — T8332XA Displacement of intrauterine contraceptive device, initial encounter: Secondary | ICD-10-CM | POA: Diagnosis present

## 2024-09-03 DIAGNOSIS — Z91013 Allergy to seafood: Secondary | ICD-10-CM | POA: Diagnosis not present

## 2024-09-03 DIAGNOSIS — R188 Other ascites: Secondary | ICD-10-CM | POA: Diagnosis present

## 2024-09-03 DIAGNOSIS — N809 Endometriosis, unspecified: Secondary | ICD-10-CM | POA: Diagnosis present

## 2024-09-03 DIAGNOSIS — K56600 Partial intestinal obstruction, unspecified as to cause: Secondary | ICD-10-CM | POA: Diagnosis present

## 2024-09-03 DIAGNOSIS — B9620 Unspecified Escherichia coli [E. coli] as the cause of diseases classified elsewhere: Secondary | ICD-10-CM | POA: Diagnosis present

## 2024-09-03 DIAGNOSIS — F17290 Nicotine dependence, other tobacco product, uncomplicated: Secondary | ICD-10-CM | POA: Diagnosis present

## 2024-09-03 DIAGNOSIS — G919 Hydrocephalus, unspecified: Secondary | ICD-10-CM | POA: Diagnosis present

## 2024-09-03 DIAGNOSIS — N838 Other noninflammatory disorders of ovary, fallopian tube and broad ligament: Secondary | ICD-10-CM | POA: Diagnosis present

## 2024-09-03 DIAGNOSIS — K659 Peritonitis, unspecified: Secondary | ICD-10-CM | POA: Diagnosis present

## 2024-09-03 DIAGNOSIS — Z832 Family history of diseases of the blood and blood-forming organs and certain disorders involving the immune mechanism: Secondary | ICD-10-CM | POA: Diagnosis not present

## 2024-09-03 DIAGNOSIS — Z743 Need for continuous supervision: Secondary | ICD-10-CM | POA: Diagnosis not present

## 2024-09-03 DIAGNOSIS — Z8673 Personal history of transient ischemic attack (TIA), and cerebral infarction without residual deficits: Secondary | ICD-10-CM | POA: Diagnosis not present

## 2024-09-03 DIAGNOSIS — I10 Essential (primary) hypertension: Secondary | ICD-10-CM | POA: Diagnosis not present

## 2024-09-03 LAB — COMPREHENSIVE METABOLIC PANEL WITH GFR
ALT: 9 U/L (ref 0–44)
AST: 11 U/L — ABNORMAL LOW (ref 15–41)
Albumin: 2.8 g/dL — ABNORMAL LOW (ref 3.5–5.0)
Alkaline Phosphatase: 82 U/L (ref 38–126)
Anion gap: 11 (ref 5–15)
BUN: 11 mg/dL (ref 6–20)
CO2: 23 mmol/L (ref 22–32)
Calcium: 9 mg/dL (ref 8.9–10.3)
Chloride: 101 mmol/L (ref 98–111)
Creatinine, Ser: 0.57 mg/dL (ref 0.44–1.00)
GFR, Estimated: >60 mL/min
Glucose, Bld: 87 mg/dL (ref 70–99)
Potassium: 3.6 mmol/L (ref 3.5–5.1)
Sodium: 135 mmol/L (ref 135–145)
Total Bilirubin: 0.3 mg/dL (ref 0.0–1.2)
Total Protein: 6.9 g/dL (ref 6.5–8.1)

## 2024-09-03 LAB — URINALYSIS, MICROSCOPIC (REFLEX): Squamous Epithelial / HPF: NONE SEEN /HPF (ref 0–5)

## 2024-09-03 LAB — URINALYSIS, ROUTINE W REFLEX MICROSCOPIC
Glucose, UA: NEGATIVE mg/dL
Ketones, ur: >80 mg/dL — AB
Leukocytes,Ua: NEGATIVE
Nitrite: POSITIVE — AB
Protein, ur: 100 mg/dL — AB
Specific Gravity, Urine: >1.03 — ABNORMAL HIGH (ref 1.005–1.030)
pH: 6 (ref 5.0–8.0)

## 2024-09-03 LAB — HIV ANTIBODY (ROUTINE TESTING W REFLEX): HIV Screen 4th Generation wRfx: NONREACTIVE

## 2024-09-03 LAB — CBC
HCT: 26.2 % — ABNORMAL LOW (ref 36.0–46.0)
HCT: 31.4 % — ABNORMAL LOW (ref 36.0–46.0)
Hemoglobin: 9 g/dL — ABNORMAL LOW (ref 12.0–15.0)
Hemoglobin: 10.2 g/dL — ABNORMAL LOW (ref 12.0–15.0)
MCH: 26.9 pg (ref 26.0–34.0)
MCH: 25.8 pg — ABNORMAL LOW (ref 26.0–34.0)
MCHC: 34.4 g/dL (ref 30.0–36.0)
MCHC: 32.5 g/dL (ref 30.0–36.0)
MCV: 78.4 fL — ABNORMAL LOW (ref 80.0–100.0)
MCV: 79.5 fL — ABNORMAL LOW (ref 80.0–100.0)
Platelets: 500 K/uL — ABNORMAL HIGH (ref 150–400)
Platelets: 610 K/uL — ABNORMAL HIGH (ref 150–400)
RBC: 3.34 MIL/uL — ABNORMAL LOW (ref 3.87–5.11)
RBC: 3.95 MIL/uL (ref 3.87–5.11)
RDW: 14.9 % (ref 11.5–15.5)
RDW: 15 % (ref 11.5–15.5)
WBC: 10.7 K/uL — ABNORMAL HIGH (ref 4.0–10.5)
WBC: 11.2 K/uL — ABNORMAL HIGH (ref 4.0–10.5)
nRBC: 0 % (ref 0.0–0.2)
nRBC: 0 % (ref 0.0–0.2)

## 2024-09-03 LAB — CREATININE, SERUM
Creatinine, Ser: 0.69 mg/dL (ref 0.44–1.00)
GFR, Estimated: >60 mL/min

## 2024-09-03 LAB — LACTATE DEHYDROGENASE: LDH: 85 U/L — ABNORMAL LOW (ref 105–235)

## 2024-09-03 LAB — PREGNANCY, URINE: Preg Test, Ur: NEGATIVE

## 2024-09-03 MED ORDER — MORPHINE SULFATE (PF) 4 MG/ML IV SOLN
4.0000 mg | INTRAVENOUS | Status: DC | PRN
  Administered 2024-09-03 – 2024-09-07 (×14): 4 mg via INTRAVENOUS
  Filled 2024-09-03 (×15): qty 1

## 2024-09-03 MED ORDER — SODIUM CHLORIDE 0.9 % IV SOLN
1.0000 g | INTRAVENOUS | Status: DC
  Administered 2024-09-04: 1 g via INTRAVENOUS
  Filled 2024-09-03: qty 10

## 2024-09-03 MED ORDER — SODIUM CHLORIDE 0.9 % IV SOLN: INTRAVENOUS | Status: DC

## 2024-09-03 MED ORDER — ENOXAPARIN SODIUM 30 MG/0.3ML IJ SOSY
30.0000 mg | PREFILLED_SYRINGE | Freq: Every day | INTRAMUSCULAR | Status: DC
  Administered 2024-09-03 – 2024-09-07 (×5): 30 mg via SUBCUTANEOUS
  Filled 2024-09-03 (×5): qty 0.3

## 2024-09-03 MED ORDER — IOHEXOL 9 MG/ML PO SOLN: 1000.0000 mL | ORAL | Status: AC

## 2024-09-03 MED ORDER — MORPHINE SULFATE (PF) 4 MG/ML IV SOLN
4.0000 mg | Freq: Once | INTRAVENOUS | Status: AC
  Administered 2024-09-03: 4 mg via INTRAVENOUS
  Filled 2024-09-03: qty 1

## 2024-09-03 MED ORDER — SODIUM CHLORIDE 0.9 % IV BOLUS
1000.0000 mL | Freq: Once | INTRAVENOUS | Status: AC
  Administered 2024-09-03: 1000 mL via INTRAVENOUS

## 2024-09-03 MED ORDER — ONDANSETRON HCL 4 MG/2ML IJ SOLN
4.0000 mg | Freq: Once | INTRAMUSCULAR | Status: AC
  Administered 2024-09-03: 4 mg via INTRAVENOUS
  Filled 2024-09-03: qty 2

## 2024-09-03 MED ORDER — HYDROMORPHONE HCL 1 MG/ML IJ SOLN
0.5000 mg | INTRAMUSCULAR | Status: DC | PRN
  Administered 2024-09-03 – 2024-09-04 (×4): 0.5 mg via INTRAVENOUS
  Filled 2024-09-03 (×4): qty 0.5

## 2024-09-03 MED ORDER — IOHEXOL 300 MG/ML  SOLN
100.0000 mL | Freq: Once | INTRAMUSCULAR | Status: AC | PRN
  Administered 2024-09-03: 75 mL via INTRAVENOUS

## 2024-09-03 MED ORDER — SODIUM CHLORIDE 0.9 % IV SOLN
1.0000 g | Freq: Once | INTRAVENOUS | Status: AC
  Administered 2024-09-03: 1 g via INTRAVENOUS
  Filled 2024-09-03: qty 10

## 2024-09-03 MED ORDER — SODIUM CHLORIDE 0.9 % IV SOLN: Freq: Once | INTRAVENOUS | Status: AC

## 2024-09-03 NOTE — Plan of Care (Addendum)
 [facility] emergency department to [facility] telemetry bed transfer:   24 year old female history of hydrocephalus with previous history of VP shunt, reactive airway disease, chronic pain syndrome presented emergency department complaining of right upper quadrant abdominal pain and distention since Thanksgiving however it has been getting worse.  At presentation to ED patient initially found tachycardic afterward heart rate improved to 109.  Otherwise hemodynamically stable.  Afebrile.  CT abdomen pelvis from 08/30/2024: 1. Distended small bowel with wall thickening and fold thickening, likely due to enteritis but could indicate early or partial small bowel obstruction. Contrast material is not to the colon yet. . 2. Moderate diffuse abdominal and pelvic ascites, progressing since the prior study. 3. 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 Pelvic ultrasound is recommended for further evaluation. 4. Low-position intrauterine device in the lower uterine segment/endocervical region.  X-ray abdomen no acute finding.   UA showing evidence of UTI.  In the ED patient received ceftriaxone , 1 L of NS bolus, morphine  and Zofran .  Hospitalist consulted for further evaluation management of abdominal pain with new onset of ascites.  Patient also has left adnexal mass.  Patient will need gynecology/oncology evaluation as well.  Obtaining pelvic ultrasound.   [facility] will assume care on arrival to accepting facility. Until arrival, care as per EDP. However, [facility] available 24/7 for questions and assistance. Check [email or web address] for on-call coverage. Nursing staff, please call [facility] Admits & Consults System-Wide number under Amion on patient's arrival so appropriate admitting provider can evaluate the pt.   Author: Vicy Medico, MD  [facility]

## 2024-09-03 NOTE — H&P (Addendum)
 " History and Physical    Crystal Greene FMW:[id number] DOB: 04-20-2001 DOA: 09/02/2024  PCP: Ozell Heron HERO, MD Patient coming from: Home  Chief Complaint: Abdominal pain and distention since Thanksgiving.  HPI: Crystal Greene is a 24 y.o. 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  Family brought her to the ER due to worsening abdominal distention and pain.  They denied any nausea vomiting.  Her last bowel movement was 4 days ago.   She does complain of urinary frequency and dysuria.  Has a history of frequent UTI. CT abdomen pelvis with contrast done on December 30 shows Distended small bowel with wall thickening and fold thickening, likely due to enteritis but could indicate early or partial small bowel obstruction. Contrast material is not to the colon yet. .Moderate diffuse abdominal and pelvic ascites, progressing since the prior study.Complex septated left adnexal cystic mass measuring 4.2 x 5.5 cm. Pelvic ultrasound is recommended for further evaluation.Low-position intrauterine device in the lower uterine segment/endocervical region. No history of decreased appetite or weight loss. ED Course: Received IV fluids morphine  and Rocephin .  Review of Systems: As per HPI otherwise all other systems reviewed and are  negative  Ambulatory Status: Ambulatory at baseline lives at home with family.  Past Medical History:  Diagnosis Date   Chronic lung disease    H/O prematurity 05/19/2013   24 wks PPROM and vaginal delivery; originally twin gestation with pregnancy complicated by a single fetal demise.     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    Stroke [facility])     Past Surgical History:  Procedure Laterality Date   EYE SURGERY     LAPAROSCOPIC REVISION VENTRICULAR-PERITONEAL (V-P) SHUNT     OVARY SURGERY     UMBILICAL HERNIA REPAIR     VENTRICULO-PERITONEAL SHUNT PLACEMENT / LAPAROSCOPIC INSERTION PERITONEAL CATHETER      Social History   Socioeconomic History   Marital status: Single    Spouse name: Not on file   Number of children: Not on file   Years of education: Not on file   Highest education level: Not on file  Occupational History   Not on file  Tobacco Use   Smoking status: Never   Smokeless tobacco: Never  Vaping Use   Vaping status: Some Days  Substance and Sexual Activity   Alcohol use: Not Currently   Drug use: Not Currently    Types: Marijuana   Sexual activity: Not Currently  Other Topics Concern   Not on file  Social History Narrative   Not on file   Social Drivers of Health   Tobacco Use: Low Risk (09/03/2024)   Patient History    Smoking Tobacco Use: Never    Smokeless Tobacco Use: Never    Passive Exposure: Not on file  Financial Resource Strain:  Not on file  Food Insecurity: Not on file  Transportation Needs: Not on file  Physical Activity: Not on file  Stress: Not on file  Social Connections: Not on file  Intimate Partner Violence: Not on file  Depression (PHQ2-9): Low Risk (01/29/2023)   Depression (PHQ2-9)    PHQ-2 Score: 2  Alcohol Screen: Not on file  Housing: Not on file  Utilities: Not on file  Health Literacy: Not on file    Allergies[1]  Family  History  Problem Relation Age of Onset   Other Mother        Alpha Thalassemia   Asthma Mother    Pneumonia Maternal Grandfather        died at age 28   High blood pressure Paternal Grandmother    Heart Problems Paternal Grandmother    Bleeding Disorder Paternal Grandmother    Heart attack Paternal Grandmother       Prior to Admission medications  Medication Sig Start Date End Date Taking? Authorizing Provider  EPINEPHrine  (EPIPEN  2-PAK) 0.3 mg/0.3 mL IJ SOAJ injection INJECT 0.3MLS INTO THE MUSCLE ONCE in case of anaphylaxis 08/09/24   Ozell Heron HERO, MD  ibuprofen  (ADVIL ) 600 MG tablet Take 1 tablet (600 mg total) by mouth every 6 (six) hours as needed. 01/14/23   Synthia Raisin, CNM    Physical Exam: Vitals:   09/03/24 0930 09/03/24 0932 09/03/24 1003 09/03/24 1233  BP:   123/83 117/78  Pulse: 100  95 (!) 108  Resp:   18 18  Temp:  98.2 F (36.8 C)  98.1 F (36.7 C)  TempSrc:  Oral    SpO2: 94%  96% 96%  Weight:      Height:         General:  Appears chronically ill-appearing in distress due to abdominal pain Cardiovascular: Regular tachycardic Respiratory: Diminished breath sounds normal respiratory effort. Abdomen: Distended tender bs diminished Neurologic:  CN 2-12 grossly intact, moves all extremities in coordinated fashion, sensation intact  Labs on Admission: I have personally reviewed following labs and imaging studies  CBC: Recent Labs  Lab 09/02/24 1817 09/03/24 0518  WBC 11.9* 10.7*  HGB 10.3* 9.0*  HCT 31.3* 26.2*  MCV 79.0* 78.4*  PLT 586* 500*   Basic Metabolic Panel: Recent Labs  Lab 09/02/24 1817 09/03/24 0518  NA 136 135  K 3.6 3.6  CL 97* 101  CO2 24 23  GLUCOSE 83 87  BUN 16 11  CREATININE 0.67 0.57  CALCIUM  9.8 9.0   GFR: Estimated Creatinine Clearance: 79.2 mL/min (by C-G formula based on SCr of 0.57 mg/dL). Liver Function Tests: Recent Labs  Lab 09/02/24 1817 09/03/24 0518  AST 11* 11*  ALT 9 9  ALKPHOS 108 82   BILITOT 0.4 0.3  PROT 8.0 6.9  ALBUMIN 3.5 2.8*   Recent Labs  Lab 09/02/24 1817  LIPASE 28   No results for input(s): AMMONIA in the last 168 hours. Coagulation Profile: No results for input(s): INR, PROTIME in the last 168 hours. Cardiac Enzymes: No results for input(s): CKTOTAL, CKMB, CKMBINDEX, TROPONINI in the last 168 hours. BNP (last 3 results) No results for input(s): PROBNP in the last 8760 hours. HbA1C: No results for input(s): HGBA1C in the last 72 hours. CBG: No results for input(s): GLUCAP in the last 168 hours. Lipid Profile: No results for input(s): CHOL, HDL, LDLCALC, TRIG, CHOLHDL, LDLDIRECT in the last 72 hours. Thyroid Function Tests: No results for input(s): TSH, T4TOTAL, FREET4, T3FREE, THYROIDAB in the  last 72 hours. Anemia Panel: No results for input(s): VITAMINB12, FOLATE, FERRITIN, TIBC, IRON, RETICCTPCT in the last 72 hours. Urine analysis:    Component Value Date/Time   COLORURINE YELLOW 09/03/2024 0008   APPEARANCEUR CLEAR 09/03/2024 0008   LABSPEC >1.030 (H) 09/03/2024 0008   PHURINE 6.0 09/03/2024 0008   GLUCOSEU NEGATIVE 09/03/2024 0008   HGBUR TRACE (A) 09/03/2024 0008   BILIRUBINUR SMALL (A) 09/03/2024 0008   BILIRUBINUR 1+ 10/17/2013 1515   KETONESUR >80 (A) 09/03/2024 0008   PROTEINUR 100 (A) 09/03/2024 0008   UROBILINOGEN negative 10/17/2013 1515   NITRITE POSITIVE (A) 09/03/2024 0008   LEUKOCYTESUR NEGATIVE 09/03/2024 0008    Creatinine Clearance: Estimated Creatinine Clearance: 79.2 mL/min (by C-G formula based on SCr of 0.57 mg/dL).  Sepsis Labs: @LABRCNTIP (procalcitonin:4,lacticidven:4) )No results found for this or any previous visit (from the past 240 hours).   Radiological Exams on Admission: US  PELVIC COMPLETE WITH TRANSVAGINAL Result Date: 09/03/2024 EXAM: US  Pelvis, Complete Transvaginal and Transabdominal without Doppler TECHNIQUE: Transabdominal and transvaginal  pelvic duplex ultrasound using B-mode/gray scaled imaging without Doppler spectral analysis and color flow was obtained. COMPARISON: CT abdomen and pelvis from 08/30/2024. CLINICAL HISTORY: [id number] Ovarian mass, left [id number] Ovarian mass, left. FINDINGS: UTERUS: Anteverted uterus measures 7.6 x 3.3 x 4.2 cm with a volume of 55.3 cc. Intramural fundal fibroid measures 1.3 x 1.2 x 2.0 cm. ENDOMETRIAL STRIPE: The endometrium measures 11 mm in thickness. IUD may be malpositioned within the lower uterine segment and cervical region. This appears similar to the CT findings from 08/30/2024. RIGHT OVARY: The right ovary is not visualized and is obscured by complex fluid and overlying bowel gas. LEFT OVARY: The left ovary is also not visualized and is obscured by complex fluid and overlying bowel gas. FREE FLUID: A large volume of fluid is identified within the pelvis, there are multiple areas of low-level internal echoes and multiple septations. On the CT from 08/30/2024 corresponding multifocal loculated fluid collections noted with a Ventriculoperitoneal shunt is in place. IMPRESSION: 1. Large volume of complex, septated pelvic fluid with low-level internal echoes, similar to prior CT, with the ovaries not visualized due to overlying fluid and bowel gas; The most likely differential diagnosis based on these findings includes peritonitis with multiple pelvic abscesses (possibly due to infected ventriculoperitoneal shunt tubing,) malignancy (e.g., ovarian cancer with ascites and peritoneal carcinomatosis) and pelvic inflammatory disease (PID) with tubo-ovarian abscesses or pyosalpinx. Appropriate follow-up guidelines include correlation with clinical symptoms and laboratory findings (e.g., CA-125), further imaging such as a dedicated pelvic MRI for better characterization of the adnexal regions and the complex fluid collections, and consideration of fluid aspiration. 2. The IUD is malpositioned within the lower uterine  segment and cervix. Electronically signed by: Waddell Calk MD 09/03/2024 09:18 AM EST RP Workstation: [id number]   DG Abdomen 1 View Result Date: 09/03/2024 EXAM: 1 VIEW XRAY OF THE ABDOMEN 09/03/2024 02:10:00 AM COMPARISON: 06/22/2022 CLINICAL HISTORY: distention, concern for early obstruction FINDINGS: BOWEL: Nonobstructive bowel gas pattern. Oral contrast material noted throughout the colon. SOFT TISSUES: IUD noted in the lower pelvis. VP shunt catheter terminates in the lower abdomen. No abnormal calcifications. BONES: No acute fracture. IMPRESSION: 1. No acute findings. Electronically signed by: Franky Crease MD 09/03/2024 02:21 AM EST RP Workstation: [id number]    Assessment/Plan Principal Problem:   Adnexal cyst   #1 partial SBO in the setting of prior abdominal surgeries ascites and adnexal mass by CT abdomen and pelvis.  This CT was done on 30 December.  Her distention and pain has been increasing every day.  Her last bowel movement was 4 days ago. No nausea vomiting reported.  Will hold off on NG tube.  Will treated with bowel rest IV fluids n.p.o. ambulate if tolerated. GYN on-call paged to discuss the findings of the CT abdomen and pelvis.she will see the patient tonight. Consulted IR for diagnostic/therapeutic paracentesis to be done tomorrow MRI Pelvis Pain control with morphine  dilaudid  IVF  # 2 VP Shunt no concern for infection at this time.  No complaints of fever neck pain headaches change in mental status confusion excetra.  Will monitor closely and consult neurosurgery if needed.  # 3 UTI ua cw with uti with symptoms \\rocephin   # 4 dehydration ua with ketones continue IVF  Estimated body mass index is 20.9 kg/m as calculated from the following:   Height as of this encounter: 5' (1.524 m).   Weight as of this encounter: 48.5 kg.   DVT prophylaxis:  lovenox  Code Status:  full Family Communication:  dw family grand mother in room Disposition Plan:  pending clinical  course Consults called:  GYN /SURGERY Admission status:  IP  Almarie KANDICE Hoots MD  09/03/2024, 12:48 PM       [1]  Allergies Allergen Reactions   Shellfish Allergy Swelling    Throat swells   Fish Allergy Swelling    Angioedema after eating tilapia - positive skin prick testing   Other Swelling     Positive skin prick testing to tree nuts.    "

## 2024-09-03 NOTE — Consult Note (Signed)
 "  OBSTETRICS AND GYNECOLOGY ATTENDING CONSULT NOTE  Consult Date: 09/03/2024 Reason for Consult: Pelvic mass, ascites Consulting Provider: Dr. Almarie Ku   Assessment/Plan: - Agree with paracentesis w/ culture & cytology for therapeutic & diagnostic purposes - Pelvic MRI ordered to better understand fluid collection/pelvic mass - Tumor markers pending - Urine GC/CT ordered - Would hold off on PID abx for now, this would be an unusual presentation for PID/TOA. UTI currently being treated with ceftriaxone  - Would engage neurosurgery to eval for shunt complication  Appreciate care of Crystal Greene by her primary team Will follow along with daily rounding on patient Please call [phone] [facility] Consult Attending Monday-Friday 8am - 5pm) or [phone] [facility] Attending On Call all day, every day) for gynecologic concerns at any time.   Thank you for involving us  in the care of this patient.  Total consultation time including face-to-face time with patient (>50% of time), reviewing chart and documentation: 45 minutes  Kieth Carolin, MD Obstetrician & Gynecologist, [facility] for [facility], [facility] Consult Phone: [phone]  History of Present Illness: Crystal Greene is an 24 y.o. G0P0000 w/ hx VP shunt who was admitted for abdominal pain and pelvic mass.   Saw PCP 12/9 with generalized abdominal pain with nausea & constipation; no vomiting, fevers, or chills. Describes pain in back and lower abdomen as burning and severe. CT A/P 12/17 with small volume ascites in the pelvis as well as 3.3 x 8.9cm bilobed structure on the right side of the pelvis.  She presented to the ED last night with worsening abdominal distension and pain. Afeb, HDS w/ mild leukocytosis (11.9), thrombocytosis (586) and e/o UTI (+nitrite). UPT neg. CT A/P from 12/30 showed worsening ascites, complex septated cystic mass within the left adnexa measuring  4.2 x 5.5cm, and e/o small bowel distension and wall thickening through to be related to enteritis vs pSBO. Pelvic US  is less certain of etiology of L adnexal mass -- large volume complex septated pelvic fluid w/ low level internal echos; ovaries were not visualized, malpositioned IUD. Ddx noted to be infected VP shunt tubing, malignancy, PID/TOA/pyosalpinx.  Denies fevers, chills, vomiting, vaginal bleeding, vaginal discharge, weight loss. Notes burning with urination for the past few days.   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  History obtained from patient and her mother.   Pertinent OB/GYN History: Patient's last menstrual period was 08/15/2024 (exact date). 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    Stroke [facility])    Past Surgical History:   Procedure Laterality Date   EYE SURGERY     LAPAROSCOPIC REVISION VENTRICULAR-PERITONEAL (V-P) SHUNT     OVARY SURGERY     UMBILICAL HERNIA REPAIR     VENTRICULO-PERITONEAL SHUNT PLACEMENT / LAPAROSCOPIC INSERTION PERITONEAL CATHETER     Family History  Problem Relation Age of Onset   Other Mother        Alpha Thalassemia   Asthma Mother    Pneumonia Maternal Grandfather        died at age 7   High blood pressure Paternal Grandmother    Heart Problems Paternal Grandmother    Bleeding Disorder Paternal Grandmother    Heart attack Paternal Grandmother    Social History:  reports that she has never smoked. She has never used smokeless tobacco. She reports that she does not currently use alcohol. She reports that she does not currently use drugs after having used the following drugs: Marijuana.  Allergies: Allergies[1]  Medications: I have reviewed the patient's current medications.  Review of Systems: Pertinent items are noted in HPI.  Focused Physical Examination: BP 117/78 (BP Location: Left Arm)   Pulse (!) 108   Temp 98.1 F (36.7 C)   Resp 18   Ht 5' (1.524 m)   Wt 48.5 kg   LMP 08/15/2024 (Exact Date)   SpO2 96%   BMI 20.90 kg/m  CONSTITUTIONAL: Resting in bed comfortably, wakes to voice. Appears uncomfortable. CARDIOVASCULAR: Mild tachycardia noted RESPIRATORY: Normal effort ABDOMEN: Tense, distended, diffuse tenderness without rebound tenderness. No palpable mass.  PELVIC: Deferred  Labs and Imaging: Results for orders placed or performed during the hospital encounter of 09/02/24 (from the past 72 hours)  Lipase, blood     Status: None   Collection Time: 09/02/24  6:17 PM  Result Value Ref Range   Lipase 28 11 - 51 U/L    Comment: Performed at [facility], [address] [place], [place] [address]  Comprehensive metabolic panel     Status: Abnormal   Collection Time: 09/02/24  6:17 PM  Result Value Ref Range   Sodium 136 135 - 145  mmol/L   Potassium 3.6 3.5 - 5.1 mmol/L   Chloride 97 (L) 98 - 111 mmol/L   CO2 24 22 - 32 mmol/L   Glucose, Bld 83 70 - 99 mg/dL    Comment: Glucose reference range applies only to samples taken after fasting for at least 8 hours.   BUN 16 6 - 20 mg/dL   Creatinine, Ser 9.32 0.44 - 1.00 mg/dL   Calcium  9.8 8.9 - 10.3 mg/dL   Total Protein 8.0 6.5 - 8.1 g/dL   Albumin 3.5 3.5 - 5.0 g/dL   AST 11 (L) 15 - 41 U/L   ALT 9 0 - 44 U/L   Alkaline Phosphatase 108 38 - 126 U/L   Total Bilirubin 0.4 0.0 - 1.2 mg/dL   GFR, Estimated >39 >39 mL/min    Comment: (NOTE) Calculated using the CKD-EPI Creatinine Equation (2021)    Anion gap 15 5 - 15    Comment: Performed at [facility], [address], [place], [place] [address]  CBC     Status: Abnormal   Collection Time: 09/02/24  6:17 PM  Result Value Ref Range   WBC 11.9 (H) 4.0 - 10.5 K/uL   RBC 3.96 3.87 - 5.11 MIL/uL   Hemoglobin 10.3 (L) 12.0 - 15.0 g/dL   HCT 68.6 (L) 63.9 - 53.9 %   MCV 79.0 (L) 80.0 - 100.0 fL   MCH 26.0 26.0 - 34.0 pg   MCHC 32.9 30.0 - 36.0 g/dL   RDW 85.1 88.4 - 84.4 %   Platelets 586 (H) 150 - 400 K/uL   nRBC 0.0 0.0 - 0.2 %    Comment: Performed at [facility], [address], [place], [place] [address]  Urinalysis, Routine w reflex microscopic -Urine, Clean Catch     Status: Abnormal   Collection Time: 09/03/24 12:08 AM  Result Value Ref Range   Color, Urine YELLOW YELLOW   APPearance CLEAR CLEAR   Specific Gravity, Urine >1.030 (H) 1.005 - 1.030   pH 6.0 5.0 - 8.0   Glucose, UA NEGATIVE NEGATIVE mg/dL   Hgb urine dipstick TRACE (A) NEGATIVE   Bilirubin Urine SMALL (A) NEGATIVE   Ketones, ur >80 (A) NEGATIVE mg/dL   Protein, ur 899 (A) NEGATIVE mg/dL   Nitrite POSITIVE (A) NEGATIVE   Leukocytes,Ua NEGATIVE NEGATIVE    Comment: Performed at [facility], [address], [place], [place] [address]  Pregnancy, urine     Status: None    Collection Time: 09/03/24 12:08 AM  Result Value Ref Range   Preg Test, Ur NEGATIVE NEGATIVE    Comment:        THE SENSITIVITY OF THIS METHODOLOGY IS >20 mIU/mL. Performed at [facility], [address] [place], [place] [address]   Urinalysis, Microscopic (reflex)     Status: Abnormal   Collection Time: 09/03/24 12:08 AM  Result Value Ref Range   RBC / HPF 0-5 0 - 5 RBC/hpf   WBC, UA 6-10 0 - 5 WBC/hpf   Bacteria, UA MANY (A) NONE SEEN   Squamous Epithelial / HPF NONE SEEN 0 - 5 /HPF   Mucus PRESENT    Hyaline Casts, UA PRESENT     Comment: Performed at [facility], [address] [place], [place] [address]  CBC     Status: Abnormal   Collection Time: 09/03/24  5:18 AM  Result Value Ref Range   WBC 10.7 (H) 4.0 - 10.5 K/uL   RBC 3.34 (L) 3.87 - 5.11 MIL/uL   Hemoglobin 9.0 (L) 12.0 - 15.0 g/dL   HCT 73.7 (L) 63.9 - 53.9 %   MCV 78.4 (L) 80.0 - 100.0 fL   MCH 26.9 26.0 - 34.0 pg   MCHC 34.4 30.0 - 36.0 g/dL   RDW 85.0 88.4 - 84.4 %   Platelets 500 (H) 150 - 400 K/uL   nRBC 0.0 0.0 - 0.2 %    Comment: Performed at [facility], [address], [place], [place] [address]  Comprehensive metabolic panel     Status: Abnormal   Collection Time: 09/03/24  5:18 AM  Result Value Ref Range   Sodium 135 135 - 145 mmol/L   Potassium 3.6 3.5 - 5.1 mmol/L   Chloride 101 98 - 111 mmol/L   CO2 23 22 - 32 mmol/L   Glucose, Bld 87 70 - 99 mg/dL    Comment: Glucose reference range applies only to samples taken after fasting for at least 8 hours.   BUN 11 6 - 20 mg/dL   Creatinine, Ser 9.42 0.44 - 1.00 mg/dL  Calcium  9.0 8.9 - 10.3 mg/dL   Total Protein 6.9 6.5 - 8.1 g/dL   Albumin 2.8 (L) 3.5 - 5.0 g/dL   AST 11 (L) 15 - 41 U/L   ALT 9 0 - 44 U/L   Alkaline Phosphatase 82 38 - 126 U/L   Total Bilirubin 0.3 0.0 - 1.2 mg/dL   GFR, Estimated >39 >39 mL/min    Comment: (NOTE) Calculated using the CKD-EPI Creatinine Equation  (2021)    Anion gap 11 5 - 15    Comment: Performed at [facility], [address] [place], [place] [address]  HIV Antibody (routine testing w rflx)     Status: None   Collection Time: 09/03/24  1:41 PM  Result Value Ref Range   HIV Screen 4th Generation wRfx Non Reactive Non Reactive    Comment: Performed at [facility], [address] [place], [place] [address]  CBC     Status: Abnormal   Collection Time: 09/03/24  1:41 PM  Result Value Ref Range   WBC 11.2 (H) 4.0 - 10.5 K/uL   RBC 3.95 3.87 - 5.11 MIL/uL   Hemoglobin 10.2 (L) 12.0 - 15.0 g/dL   HCT 68.5 (L) 63.9 - 53.9 %   MCV 79.5 (L) 80.0 - 100.0 fL   MCH 25.8 (L) 26.0 - 34.0 pg   MCHC 32.5 30.0 - 36.0 g/dL   RDW 84.9 88.4 - 84.4 %   Platelets 610 (H) 150 - 400 K/uL   nRBC 0.0 0.0 - 0.2 %    Comment: Performed at [facility], [address], [place], [place] [address]  Creatinine, serum     Status: None   Collection Time: 09/03/24  1:41 PM  Result Value Ref Range   Creatinine, Ser 0.69 0.44 - 1.00 mg/dL   GFR, Estimated >39 >39 mL/min    Comment: (NOTE) Calculated using the CKD-EPI Creatinine Equation (2021) Performed at [facility], [address] [place], [place] [address]   Lactate dehydrogenase     Status: Abnormal   Collection Time: 09/03/24  2:59 PM  Result Value Ref Range   LDH 85 (L) 105 - 235 U/L    Comment: Performed at [facility], [address] [place], [place] [address]    US  PELVIC COMPLETE WITH TRANSVAGINAL Result Date: 09/03/2024 EXAM: US  Pelvis, Complete Transvaginal and Transabdominal without Doppler TECHNIQUE: Transabdominal and transvaginal pelvic duplex ultrasound using B-mode/gray scaled imaging without Doppler spectral analysis and color flow was obtained. COMPARISON: CT abdomen and pelvis from 08/30/2024. CLINICAL HISTORY: [id number] Ovarian mass, left [id number] Ovarian mass, left. FINDINGS: UTERUS: Anteverted  uterus measures 7.6 x 3.3 x 4.2 cm with a volume of 55.3 cc. Intramural fundal fibroid measures 1.3 x 1.2 x 2.0 cm. ENDOMETRIAL STRIPE: The endometrium measures 11 mm in thickness. IUD may be malpositioned within the lower uterine segment and cervical region. This appears similar to the CT findings from 08/30/2024. RIGHT OVARY: The right ovary is not visualized and is obscured by complex fluid and overlying bowel gas. LEFT OVARY: The left ovary is also not visualized and is obscured by complex fluid and overlying bowel gas. FREE FLUID: A large volume of fluid is identified within the pelvis, there are multiple areas of low-level internal echoes and multiple septations. On the CT from 08/30/2024 corresponding multifocal loculated fluid collections noted with a Ventriculoperitoneal shunt is in place. IMPRESSION: 1. Large volume of complex, septated pelvic fluid with low-level internal echoes,  similar to prior CT, with the ovaries not visualized due to overlying fluid and bowel gas; The most likely differential diagnosis based on these findings includes peritonitis with multiple pelvic abscesses (possibly due to infected ventriculoperitoneal shunt tubing,) malignancy (e.g., ovarian cancer with ascites and peritoneal carcinomatosis) and pelvic inflammatory disease (PID) with tubo-ovarian abscesses or pyosalpinx. Appropriate follow-up guidelines include correlation with clinical symptoms and laboratory findings (e.g., CA-125), further imaging such as a dedicated pelvic MRI for better characterization of the adnexal regions and the complex fluid collections, and consideration of fluid aspiration. 2. The IUD is malpositioned within the lower uterine segment and cervix. Electronically signed by: Waddell Calk MD 09/03/2024 09:18 AM EST RP Workstation: [id number]   DG Abdomen 1 View Result Date: 09/03/2024 EXAM: 1 VIEW XRAY OF THE ABDOMEN 09/03/2024 02:10:00 AM COMPARISON: 06/22/2022 CLINICAL HISTORY: distention, concern  for early obstruction FINDINGS: BOWEL: Nonobstructive bowel gas pattern. Oral contrast material noted throughout the colon. SOFT TISSUES: IUD noted in the lower pelvis. VP shunt catheter terminates in the lower abdomen. No abnormal calcifications. BONES: No acute fracture. IMPRESSION: 1. No acute findings. Electronically signed by: Franky Crease MD 09/03/2024 02:21 AM EST RP Workstation: [id number]       [1]  Allergies Allergen Reactions   Shellfish Allergy Swelling    Throat swells   Fish Allergy Swelling    Angioedema after eating tilapia - positive skin prick testing   Other Swelling     Positive skin prick testing to tree nuts.    "

## 2024-09-03 NOTE — Consult Note (Signed)
 " CC: belly pain  Reason for consult: possible SBO  Requesting provider: Dr Alvia  HPI: Crystal Greene is an 24 y.o. female who is here for worsening abdominal pain.  Most of the history comes from the mother.  The patient does provide some history at times.  She has a past medical history significant for VP shunt since childhood, prematurity, hydrocephalus with IVH.  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  The patient states that she has been having abdominal pain since around Thanksgiving.  It has progressively gotten worse.  She saw her PCP on December 9 who ordered a CT scan.  She initially underwent a noncontrasted CT scan on December 17 that showed some ascites as well as ill-defined cystic lesions in her pelvis on the right side as well as 1 in the left lower quadrant.  A contrasted CT scan was recommended.  She underwent that CT on December 30 which showed some distended small bowel with wall thickening with contrast getting to the ileum.  This is felt to be reactive as to what was going on in the pelvis.  She had worsening ascites on the CT.  There was the obvious VP shunt.  There is a complex septated left adnexal cystic mass measuring 4 x 5 cm.  She was brought in for pain control and medical management.  She states that she had a bowel movement after that CT with contrast.  She has not had a bowel movement since the 30th.  She denies any nausea or vomiting.  She feels that her abdomen is very tight and distended.  She had a follow-up x-ray on January 3 that showed that the oral contrast had progressed to the colon with no significant small bowel dilatation.  She had a pelvic ultrasound today as well which the results are below.  She denies any weight loss.  She denies any fever or chills.  She denies any nausea vomiting.  She does  endorse some urinary symptoms.  No melena or medic easier.  Again getting history from her is a little bit challenging.  Past Medical History:  Diagnosis Date   Chronic lung disease    H/O prematurity 05/19/2013   24 wks PPROM and vaginal delivery; originally twin gestation with pregnancy complicated by a single fetal demise.     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    Stroke [facility])     Past Surgical History:  Procedure Laterality Date   EYE SURGERY     LAPAROSCOPIC REVISION VENTRICULAR-PERITONEAL (V-P) SHUNT     OVARY SURGERY     UMBILICAL HERNIA REPAIR     VENTRICULO-PERITONEAL SHUNT PLACEMENT / LAPAROSCOPIC INSERTION PERITONEAL CATHETER      Family History  Problem Relation Age of Onset   Other Mother        Alpha Thalassemia   Asthma Mother    Pneumonia Maternal Grandfather        died at age 102   High blood pressure Paternal Grandmother    Heart Problems Paternal Grandmother    Bleeding Disorder Paternal Grandmother    Heart attack Paternal Grandmother     Social:  reports that she has never smoked. She has never used  smokeless tobacco. She reports that she does not currently use alcohol. She reports that she does not currently use drugs after having used the following drugs: Marijuana.  Allergies: Allergies[1]  Medications: I have reviewed the patient's current medications.   ROS - all of the below systems have been reviewed with the patient and positives are indicated with bold text General: chills, fever or night sweats Eyes: blurry vision or double vision ENT: epistaxis or sore throat Allergy/Immunology: itchy/watery eyes or nasal congestion Hematologic/Lymphatic: bleeding problems, blood clots or swollen lymph nodes Endocrine: temperature intolerance or unexpected weight changes Breast: new or changing breast lumps or nipple discharge Resp: cough, shortness of  breath, or wheezing CV: chest pain or dyspnea on exertion GI: as per HPI GU: dysuria, trouble voiding, or hematuria MSK: joint pain or joint stiffness Neuro: TIA or stroke symptoms Derm: pruritus and skin lesion changes Psych: anxiety and depression  PE Blood pressure 117/78, pulse (!) 108, temperature 98.1 F (36.7 C), resp. rate 18, height 5' (1.524 m), weight 48.5 kg, last menstrual period 08/15/2024, SpO2 96%. Constitutional: NAD; conversant; no deformities; she appears a little bit uncomfortable Eyes: Moist conjunctiva; no lid lag; anicteric; PERRL Neck: Trachea midline; no thyromegaly Lungs: Normal respiratory effort; no tactile fremitus CV: RRR; no palpable thrills; no pitting edema GI: Abd abdomen is firm and tight and distended but no peritonitis but has guarding; no palpable hepatosplenomegaly; old surgical scars MSK:  no clubbing/cyanosis Psychiatric: Appropriate affect; alert and oriented x3 Lymphatic: No palpable cervical or axillary lymphadenopathy Skin: No rash lesions or jaundice  Results for orders placed or performed during the hospital encounter of 09/02/24 (from the past 48 hours)  Lipase, blood     Status: None   Collection Time: 09/02/24  6:17 PM  Result Value Ref Range   Lipase 28 11 - 51 U/L    Comment: Performed at [facility], [address] [place], [place], [place] [address]  Comprehensive metabolic panel     Status: Abnormal   Collection Time: 09/02/24  6:17 PM  Result Value Ref Range   Sodium 136 135 - 145 mmol/L   Potassium 3.6 3.5 - 5.1 mmol/L   Chloride 97 (L) 98 - 111 mmol/L   CO2 24 22 - 32 mmol/L   Glucose, Bld 83 70 - 99 mg/dL    Comment: Glucose reference range applies only to samples taken after fasting for at least 8 hours.   BUN 16 6 - 20 mg/dL   Creatinine, Ser 9.32 0.44 - 1.00 mg/dL   Calcium  9.8 8.9 - 10.3 mg/dL   Total Protein 8.0 6.5 - 8.1 g/dL   Albumin 3.5 3.5 - 5.0 g/dL   AST 11 (L) 15 - 41 U/L   ALT 9 0 - 44  U/L   Alkaline Phosphatase 108 38 - 126 U/L   Total Bilirubin 0.4 0.0 - 1.2 mg/dL   GFR, Estimated >39 >39 mL/min    Comment: (NOTE) Calculated using the CKD-EPI Creatinine Equation (2021)    Anion gap 15 5 - 15    Comment: Performed at [facility], [address] [place], [place], [place] [address]  CBC     Status: Abnormal   Collection Time: 09/02/24  6:17 PM  Result Value Ref Range   WBC 11.9 (H) 4.0 - 10.5 K/uL   RBC 3.96 3.87 - 5.11 MIL/uL   Hemoglobin 10.3 (L) 12.0 - 15.0 g/dL   HCT 68.6 (L) 63.9 - 53.9 %   MCV 79.0 (L) 80.0 -  100.0 fL   MCH 26.0 26.0 - 34.0 pg   MCHC 32.9 30.0 - 36.0 g/dL   RDW 85.1 88.4 - 84.4 %   Platelets 586 (H) 150 - 400 K/uL   nRBC 0.0 0.0 - 0.2 %    Comment: Performed at [facility], [address] [place], [place] [address]  Urinalysis, Routine w reflex microscopic -Urine, Clean Catch     Status: Abnormal   Collection Time: 09/03/24 12:08 AM  Result Value Ref Range   Color, Urine YELLOW YELLOW   APPearance CLEAR CLEAR   Specific Gravity, Urine >1.030 (H) 1.005 - 1.030   pH 6.0 5.0 - 8.0   Glucose, UA NEGATIVE NEGATIVE mg/dL   Hgb urine dipstick TRACE (A) NEGATIVE   Bilirubin Urine SMALL (A) NEGATIVE   Ketones, ur >80 (A) NEGATIVE mg/dL   Protein, ur 899 (A) NEGATIVE mg/dL   Nitrite POSITIVE (A) NEGATIVE   Leukocytes,Ua NEGATIVE NEGATIVE    Comment: Performed at [facility], [address] [place], [place] [address]  Pregnancy, urine     Status: None   Collection Time: 09/03/24 12:08 AM  Result Value Ref Range   Preg Test, Ur NEGATIVE NEGATIVE    Comment:        THE SENSITIVITY OF THIS METHODOLOGY IS >20 mIU/mL. Performed at [facility], [address] [place], [place] [address]   Urinalysis, Microscopic (reflex)     Status: Abnormal   Collection Time: 09/03/24 12:08 AM  Result Value Ref Range   RBC / HPF 0-5 0 - 5 RBC/hpf   WBC, UA 6-10 0 - 5 WBC/hpf    Bacteria, UA MANY (A) NONE SEEN   Squamous Epithelial / HPF NONE SEEN 0 - 5 /HPF   Mucus PRESENT    Hyaline Casts, UA PRESENT     Comment: Performed at [facility], [address], [place], [place] [address]  CBC     Status: Abnormal   Collection Time: 09/03/24  5:18 AM  Result Value Ref Range   WBC 10.7 (H) 4.0 - 10.5 K/uL   RBC 3.34 (L) 3.87 - 5.11 MIL/uL   Hemoglobin 9.0 (L) 12.0 - 15.0 g/dL   HCT 73.7 (L) 63.9 - 53.9 %   MCV 78.4 (L) 80.0 - 100.0 fL   MCH 26.9 26.0 - 34.0 pg   MCHC 34.4 30.0 - 36.0 g/dL   RDW 85.0 88.4 - 84.4 %   Platelets 500 (H) 150 - 400 K/uL   nRBC 0.0 0.0 - 0.2 %    Comment: Performed at [facility], [address] [place], [place] [address]  Comprehensive metabolic panel     Status: Abnormal   Collection Time: 09/03/24  5:18 AM  Result Value Ref Range   Sodium 135 135 - 145 mmol/L   Potassium 3.6 3.5 - 5.1 mmol/L   Chloride 101 98 - 111 mmol/L   CO2 23 22 - 32 mmol/L   Glucose, Bld 87 70 - 99 mg/dL    Comment: Glucose reference range applies only to samples taken after fasting for at least 8 hours.   BUN 11 6 - 20 mg/dL   Creatinine, Ser 9.42 0.44 - 1.00 mg/dL   Calcium  9.0 8.9 - 10.3 mg/dL   Total Protein 6.9 6.5 - 8.1 g/dL   Albumin 2.8 (L) 3.5 - 5.0 g/dL   AST 11 (L) 15 - 41 U/L   ALT 9 0 - 44 U/L   Alkaline Phosphatase 82  38 - 126 U/L   Total Bilirubin 0.3 0.0 - 1.2 mg/dL   GFR, Estimated >39 >39 mL/min    Comment: (NOTE) Calculated using the CKD-EPI Creatinine Equation (2021)    Anion gap 11 5 - 15    Comment: Performed at [facility], [address], [place], [place] [address]  HIV Antibody (routine testing w rflx)     Status: None   Collection Time: 09/03/24  1:41 PM  Result Value Ref Range   HIV Screen 4th Generation wRfx Non Reactive Non Reactive    Comment: Performed at [facility], [address] [place], [place] [address]  CBC     Status: Abnormal    Collection Time: 09/03/24  1:41 PM  Result Value Ref Range   WBC 11.2 (H) 4.0 - 10.5 K/uL   RBC 3.95 3.87 - 5.11 MIL/uL   Hemoglobin 10.2 (L) 12.0 - 15.0 g/dL   HCT 68.5 (L) 63.9 - 53.9 %   MCV 79.5 (L) 80.0 - 100.0 fL   MCH 25.8 (L) 26.0 - 34.0 pg   MCHC 32.5 30.0 - 36.0 g/dL   RDW 84.9 88.4 - 84.4 %   Platelets 610 (H) 150 - 400 K/uL   nRBC 0.0 0.0 - 0.2 %    Comment: Performed at [facility], [address] [place], [place] [address]  Creatinine, serum     Status: None   Collection Time: 09/03/24  1:41 PM  Result Value Ref Range   Creatinine, Ser 0.69 0.44 - 1.00 mg/dL   GFR, Estimated >39 >39 mL/min    Comment: (NOTE) Calculated using the CKD-EPI Creatinine Equation (2021) Performed at [facility], [address], [place], [place] [address]   Lactate dehydrogenase     Status: Abnormal   Collection Time: 09/03/24  2:59 PM  Result Value Ref Range   LDH 85 (L) 105 - 235 U/L    Comment: Performed at [facility], [address] [place], [place] [address]    US  PELVIC COMPLETE WITH TRANSVAGINAL Result Date: 09/03/2024 EXAM: US  Pelvis, Complete Transvaginal and Transabdominal without Doppler TECHNIQUE: Transabdominal and transvaginal pelvic duplex ultrasound using B-mode/gray scaled imaging without Doppler spectral analysis and color flow was obtained. COMPARISON: CT abdomen and pelvis from 08/30/2024. CLINICAL HISTORY: [id number] Ovarian mass, left [id number] Ovarian mass, left. FINDINGS: UTERUS: Anteverted uterus measures 7.6 x 3.3 x 4.2 cm with a volume of 55.3 cc. Intramural fundal fibroid measures 1.3 x 1.2 x 2.0 cm. ENDOMETRIAL STRIPE: The endometrium measures 11 mm in thickness. IUD may be malpositioned within the lower uterine segment and cervical region. This appears similar to the CT findings from 08/30/2024. RIGHT OVARY: The right ovary is not visualized and is obscured by complex fluid and overlying bowel gas. LEFT OVARY:  The left ovary is also not visualized and is obscured by complex fluid and overlying bowel gas. FREE FLUID: A large volume of fluid is identified within the pelvis, there are multiple areas of low-level internal echoes and multiple septations. On the CT from 08/30/2024 corresponding multifocal loculated fluid collections noted with a Ventriculoperitoneal shunt is in place. IMPRESSION: 1. Large volume of complex, septated pelvic fluid with low-level internal echoes, similar to prior CT, with the ovaries not visualized due to overlying fluid and bowel gas; The most likely differential diagnosis based on these findings includes peritonitis with multiple pelvic abscesses (possibly due to infected ventriculoperitoneal shunt tubing,) malignancy (e.g., ovarian cancer with ascites and peritoneal  carcinomatosis) and pelvic inflammatory disease (PID) with tubo-ovarian abscesses or pyosalpinx. Appropriate follow-up guidelines include correlation with clinical symptoms and laboratory findings (e.g., CA-125), further imaging such as a dedicated pelvic MRI for better characterization of the adnexal regions and the complex fluid collections, and consideration of fluid aspiration. 2. The IUD is malpositioned within the lower uterine segment and cervix. Electronically signed by: Waddell Calk MD 09/03/2024 09:18 AM EST RP Workstation: [id number]   DG Abdomen 1 View Result Date: 09/03/2024 EXAM: 1 VIEW XRAY OF THE ABDOMEN 09/03/2024 02:10:00 AM COMPARISON: 06/22/2022 CLINICAL HISTORY: distention, concern for early obstruction FINDINGS: BOWEL: Nonobstructive bowel gas pattern. Oral contrast material noted throughout the colon. SOFT TISSUES: IUD noted in the lower pelvis. VP shunt catheter terminates in the lower abdomen. No abnormal calcifications. BONES: No acute fracture. IMPRESSION: 1. No acute findings. Electronically signed by: Franky Crease MD 09/03/2024 02:21 AM EST RP Workstation: [id number]    Imaging: Personally reviewed  imaging  A/P: Crystal Greene is an 24 y.o. female with  Left adnexal cystic mass 4.2 x 5.5 cm Abdominal pelvic ascites Acute anemia of unclear etiology Mild leukocytosis Probable ileus UTI   I think the majority of her abdominal discomfort is probably coming from the significant ascites she has in her abdomen.  While she did have some dilated small bowel on her CT scan from December 30 I think that is reactive due to the ascites and the pelvic process.  The contrast that she drank for her CT is in her colon today with no obvious small bowel dilation on her plain film from today.  I think she may be having an ileus.  At this point I am not sure an NG tube would be of much use since she does not appear to have significant small bowel dilatation on her plain film from today.  The patient denies any burping or belching.  However the patient were to start having vomiting I would recommend placing an NG tube  Differential is broad regarding what is going on in the pelvis from a benign to infectious to malignant process; also in the differential is an infected VP shunt but will defer to triad to evaluate that  GYN needs to see her to drive the workup and evaluation Patient may need paracentesis to sample the fluid Follow-up tumor markers and labs had been drawn Patient on Rocephin  for probable UTI  Discussed with patient's mother, grandmother and father at the bedside  Data reviewed: I personally reviewed the images of her CT abdomen pelvis from December 17, December 30, plain film of her abdomen from today, reviewed the ultrasound report.  Labs since December 9, PCP note from December 9, H&P, progress notes  high medical decision making  Camellia HERO. Tanda, MD, FACS General, Bariatric, & Minimally Invasive Surgery [facility]      [1]  Allergies Allergen Reactions   Shellfish Allergy Swelling    Throat swells   Fish Allergy Swelling    Angioedema  after eating tilapia - positive skin prick testing   Other Swelling     Positive skin prick testing to tree nuts.    "

## 2024-09-03 NOTE — ED Provider Notes (Signed)
 " [place] [facility]ERGENC[facility] DEPARTMENT AT [facility] Provider Note   CSN: [id number] Arrival date & time: 09/02/24  1756     Patient presents with: Abdominal Pain   Crystal Greene is a 24 y.o. female.   HPI    This is a 24 year old female with a history of VP shunt and hydrocephalus who presents with concern for abdominal pain and distention.  Patient reports she has been having symptoms since Thanksgiving.  Primarily she has had abdominal pain generalized across the abdomen.  Over the last several days she has had worsening abdominal distention.  Initially she was having issues with bowel movements and they tried magnesium citrate with some relief.  She had a CT scan several days ago for the same but does not know the results.  She had a good bowel movement after oral contrast.  She denies any nausea or vomiting.  No fevers.  Does report some dysuria.  Chart reviewed.  CT imaging has not been read.  Original noncontrasted CT scan showed fluid collections in the abdomen that were nonspecific.  Repeat CT imaging with contrast is pending.  [facility] radiology to expedite read.  Prior to Admission medications  Medication Sig Start Date End Date Taking? Authorizing Provider  EPINEPHrine  (EPIPEN  2-PAK) 0.3 mg/0.3 mL IJ SOAJ injection INJECT 0.3MLS INTO THE MUSCLE ONCE in case of anaphylaxis 08/09/24   Ozell Heron HERO, MD  ibuprofen  (ADVIL ) 600 MG tablet Take 1 tablet (600 mg total) by mouth every 6 (six) hours as needed. 01/14/23   Synthia Raisin, CNM    Allergies: Shellfish allergy, Fish allergy, and Other    Review of Systems  Constitutional:  Negative for fever.  Respiratory:  Negative for shortness of breath.   Cardiovascular:  Negative for chest pain.  Gastrointestinal:  Positive for abdominal distention, abdominal pain and constipation. Negative for diarrhea, nausea and vomiting.  All other systems reviewed and are negative.   Updated Vital Signs BP 117/74   Pulse  (!) 109   Temp 98 F (36.7 C) (Oral)   Resp 18   Ht 1.524 m (5')   Wt 48.5 kg   LMP 08/15/2024 (Exact Date)   SpO2 97%   BMI 20.90 kg/m   Physical Exam Vitals and nursing note reviewed.  Constitutional:      Comments: Chronically ill-appearing but nontoxic  HENT:     Head: Normocephalic and atraumatic.     Mouth/Throat:     Comments: Mucous membranes dry Eyes:     Pupils: Pupils are equal, round, and reactive to light.  Cardiovascular:     Rate and Rhythm: Regular rhythm. Tachycardia present.     Heart sounds: Normal heart sounds.  Pulmonary:     Effort: Pulmonary effort is normal. No respiratory distress.     Breath sounds: No wheezing.  Abdominal:     General: Abdomen is protuberant. A surgical scar is present. Bowel sounds are decreased. There is distension.     Palpations: Abdomen is soft.     Tenderness: There is generalized abdominal tenderness.  Musculoskeletal:     Cervical back: Neck supple.  Skin:    General: Skin is warm and dry.  Neurological:     Mental Status: She is alert and oriented to person, place, and time.  Psychiatric:        Mood and Affect: Mood normal.     (all labs ordered are listed, but only abnormal results are displayed) Labs Reviewed  COMPREHENSIVE METABOLIC PANEL WITH GFR -  Abnormal; Notable for the following components:      Result Value   Chloride 97 (*)    AST 11 (*)    All other components within normal limits  CBC - Abnormal; Notable for the following components:   WBC 11.9 (*)    Hemoglobin 10.3 (*)    HCT 31.3 (*)    MCV 79.0 (*)    Platelets 586 (*)    All other components within normal limits  URINALYSIS, ROUTINE W REFLEX MICROSCOPIC - Abnormal; Notable for the following components:   Specific Gravity, Urine >1.030 (*)    Hgb urine dipstick TRACE (*)    Bilirubin Urine SMALL (*)    Ketones, ur >80 (*)    Protein, ur 100 (*)    Nitrite POSITIVE (*)    All other components within normal limits  URINALYSIS,  MICROSCOPIC (REFLEX) - Abnormal; Notable for the following components:   Bacteria, UA MANY (*)    All other components within normal limits  URINE CULTURE  LIPASE, BLOOD  PREGNANCY, URINE  CBC  COMPREHENSIVE METABOLIC PANEL WITH GFR    EKG: None  Radiology: DG Abdomen 1 View Result Date: 09/03/2024 EXAM: 1 VIEW XRAY OF THE ABDOMEN 09/03/2024 02:10:00 AM COMPARISON: 06/22/2022 CLINICAL HISTORY: distention, concern for early obstruction FINDINGS: BOWEL: Nonobstructive bowel gas pattern. Oral contrast material noted throughout the colon. SOFT TISSUES: IUD noted in the lower pelvis. VP shunt catheter terminates in the lower abdomen. No abnormal calcifications. BONES: No acute fracture. IMPRESSION: 1. No acute findings. Electronically signed by: Franky Crease MD 09/03/2024 02:21 AM EST RP Workstation: [id number]     Procedures   Medications Ordered in the ED  cefTRIAXone  (ROCEPHIN ) 1 g in sodium chloride  0.9 % 100 mL IVPB (1 g Intravenous New Bag/Given 09/03/24 0243)  morphine  (PF) 4 MG/ML injection 4 mg (4 mg Intravenous Given 09/03/24 0239)  morphine  (PF) 4 MG/ML injection 4 mg (4 mg Intravenous Given 09/03/24 0034)  ondansetron  (ZOFRAN ) injection 4 mg (4 mg Intravenous Given 09/03/24 0034)  sodium chloride  0.9 % bolus 1,000 mL (0 mLs Intravenous Stopped 09/03/24 0143)  0.9 %  sodium chloride  infusion ( Intravenous New Bag/Given 09/03/24 0246)    Clinical Course as of 09/03/24 0255  Sat Sep 03, 2024  0105 Spoke to [facility] radiology to get an ASAP read on CT scan with contrast [CH]  0224 Discussed results of the CT imaging with the patient and her mother.  Concerning for potential ovarian malignancy.  Abdominal ascites has increased.  She is having ongoing pain.  She does have evidence of UTI as well.  Will culture urine and give antibiotics.  She will need further pelvic ultrasound imaging and likely gynecologic evaluation.  However given ongoing pain, will plan for admission for this workup. [CH]   0241 Spoke to hospitalist for admission. [CH]    Clinical Course User Index [CH] Londynn Sonoda, Charmaine FALCON, MD                                 Medical Decision Making Amount and/or Complexity of Data Reviewed Labs: ordered. Radiology: ordered.  Risk Prescription drug management. Decision regarding hospitalization.   This patient presents to the ED for concern of abdominal pain and distention, this involves an extensive number of treatment options, and is a complaint that carries with it a high risk of complications and morbidity.  I considered the following differential and admission for this acute,  potentially life threatening condition.  The differential diagnosis includes bowel obstruction, shunt dysfunction, pancreatitis, cholecystitis, urinary tract infection, fluid accumulation  MDM:    This is a 24 year old female who presents with subacute worsening abdominal symptoms over the last 4 to 6 weeks.  Worsening distention and pain over the last several days.  Had outpatient CT imaging.  Noncontrasted CT concerning for fluid collections in the abdomen.  Repeat contrasted CT imaging had not been read.  Patient is distended on exam and fairly tender.  She was given pain and nausea medication.  Labs obtained.  CT imaging was read and concerning for new onset and worsening ascites and a left ovarian mass.  VP shunt appears well-placed.  Given full history, physical exam findings, and imaging, I have high suspicion and concern for primary ovarian malignancy.  Lower suspicion for VP shunt malfunction as patient is not having any other symptoms.  She does have evidence of a UTI.  Urine culture was sent and she was given IV Rocephin .  Do not have ultrasound imaging at this time.  She is having persistent abdominal pain.  Today's abdominal x-ray does not show any evidence of obstructive pathology.  Given ongoing pain and distention, will plan for admission for pelvic ultrasound imaging and pain control.   She will need antibiotics for her urinary tract infection and likely will need Gyn/Onc evaluation.  Ultrasound ordered as well as CA125.    (Labs, imaging, consults)  Labs: I Ordered, and personally interpreted labs.  The pertinent results include: CBC, CMP, lipase, urinalysis  Imaging Studies ordered: I ordered imaging studies including CT, x-ray I independently visualized and interpreted imaging. I agree with the radiologist interpretation  Additional history obtained from chart review.  External records from outside source obtained and reviewed including prior evaluations  Cardiac Monitoring: The patient was maintained on a cardiac monitor.  If on the cardiac monitor, I personally viewed and interpreted the cardiac monitored which showed an underlying rhythm of: Tachycardia  Reevaluation: After the interventions noted above, I reevaluated the patient and found that they have :stayed the same  Social Determinants of Health:  lives independently  Disposition: Admit  Co morbidities that complicate the patient evaluation  Past Medical History:  Diagnosis Date   Chronic lung disease    H/O prematurity 05/19/2013   24 wks PPROM and vaginal delivery; originally twin gestation with pregnancy complicated by a single fetal demise.     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    Stroke (HCC)      Medicines Meds ordered this encounter  Medications   morphine  (PF) 4 MG/ML injection 4 mg   ondansetron  (ZOFRAN ) injection 4 mg   sodium chloride  0.9 % bolus 1,000 mL   cefTRIAXone  (ROCEPHIN ) 1 g in sodium chloride  0.9 % 100 mL IVPB    Antibiotic Indication::   UTI   morphine  (PF) 4 MG/ML injection 4 mg   0.9 %  sodium chloride  infusion    I have reviewed the patients home medicines and have made adjustments as needed  Problem List / ED Course: Problem List Items Addressed This Visit    None Visit Diagnoses       Other ascites    -  Primary     Ovarian mass, left         Acute cystitis without hematuria  Final diagnoses:  Other ascites  Ovarian mass, left  Acute cystitis without hematuria    ED Discharge Orders     None          Elona Yinger, Charmaine FALCON, MD 09/03/24 0300  "

## 2024-09-03 NOTE — Plan of Care (Signed)
   Problem: Education: Goal: Knowledge of General Education information will improve Description Including pain rating scale, medication(s)/side effects and non-pharmacologic comfort measures Outcome: Progressing

## 2024-09-03 NOTE — Progress Notes (Signed)
" ° °  Brief Progress Note   _____________________________________________________________________________________________________________  Patient Name: Crystal Greene Patient DOB: February 28, 2001 Date: 09/03/2024     Data: Patient being admitted to [facility].    Action: Patient has bed assigned at [facility], 1319, bed still dirty.     Response:  Updated Daniel RN that bed had been assigned and awaiting to be cleaned.  ADDENDUM 1059: Sent secure chat to [employer] that bed is marked ready on bed board.  _____________________________________________________________________________________________________________  [facility] RN Expeditor Sydni Elizarraraz Please contact us  directly via secure chat (search for [facility]) or by calling us  at [phone] [facility]).  "

## 2024-09-03 NOTE — ED Notes (Signed)
 Called [facility] to transport the patient to [facility] rm# [id number]

## 2024-09-04 ENCOUNTER — Inpatient Hospital Stay (HOSPITAL_COMMUNITY)

## 2024-09-04 ENCOUNTER — Other Ambulatory Visit (HOSPITAL_COMMUNITY): Payer: Self-pay | Admitting: Radiology

## 2024-09-04 DIAGNOSIS — N949 Unspecified condition associated with female genital organs and menstrual cycle: Secondary | ICD-10-CM | POA: Diagnosis not present

## 2024-09-04 LAB — AFP TUMOR MARKER: AFP, Serum, Tumor Marker: <1.8 ng/mL (ref 0.0–4.7)

## 2024-09-04 LAB — CBC
HCT: 31.9 % — ABNORMAL LOW (ref 36.0–46.0)
Hemoglobin: 10.2 g/dL — ABNORMAL LOW (ref 12.0–15.0)
MCH: 25.8 pg — ABNORMAL LOW (ref 26.0–34.0)
MCHC: 32 g/dL (ref 30.0–36.0)
MCV: 80.6 fL (ref 80.0–100.0)
Platelets: 594 K/uL — ABNORMAL HIGH (ref 150–400)
RBC: 3.96 MIL/uL (ref 3.87–5.11)
RDW: 15.2 % (ref 11.5–15.5)
WBC: 13 K/uL — ABNORMAL HIGH (ref 4.0–10.5)
nRBC: 0 % (ref 0.0–0.2)

## 2024-09-04 LAB — COMPREHENSIVE METABOLIC PANEL WITH GFR
ALT: 14 U/L (ref 0–44)
AST: 23 U/L (ref 15–41)
Albumin: 3 g/dL — ABNORMAL LOW (ref 3.5–5.0)
Alkaline Phosphatase: 108 U/L (ref 38–126)
Anion gap: 16 — ABNORMAL HIGH (ref 5–15)
BUN: 8 mg/dL (ref 6–20)
CO2: 20 mmol/L — ABNORMAL LOW (ref 22–32)
Calcium: 9.5 mg/dL (ref 8.9–10.3)
Chloride: 101 mmol/L (ref 98–111)
Creatinine, Ser: 0.62 mg/dL (ref 0.44–1.00)
GFR, Estimated: >60 mL/min
Glucose, Bld: 82 mg/dL (ref 70–99)
Potassium: 3.9 mmol/L (ref 3.5–5.1)
Sodium: 137 mmol/L (ref 135–145)
Total Bilirubin: 0.4 mg/dL (ref 0.0–1.2)
Total Protein: 7.6 g/dL (ref 6.5–8.1)

## 2024-09-04 LAB — BODY FLUID CELL COUNT WITH DIFFERENTIAL
Eos, Fluid: 0 %
Lymphs, Fluid: 3 %
Monocyte-Macrophage-Serous Fluid: 5 % — ABNORMAL LOW (ref 50–90)
Neutrophil Count, Fluid: 92 % — ABNORMAL HIGH (ref 0–25)
Total Nucleated Cell Count, Fluid: 738 uL (ref 0–1000)

## 2024-09-04 LAB — CANCER ANTIGEN 19-9: CA 19-9: 11 U/mL (ref 0–35)

## 2024-09-04 LAB — CA 125: Cancer Antigen (CA) 125: 39.4 U/mL — ABNORMAL HIGH (ref 0.0–38.1)

## 2024-09-04 LAB — CEA: CEA: 0.8 ng/mL (ref 0.0–4.7)

## 2024-09-04 MED ORDER — CALCIUM CARBONATE ANTACID 500 MG PO CHEW
400.0000 mg | CHEWABLE_TABLET | Freq: Once | ORAL | Status: AC
  Administered 2024-09-04: 400 mg via ORAL
  Filled 2024-09-04: qty 2

## 2024-09-04 MED ORDER — HYDROMORPHONE HCL 1 MG/ML IJ SOLN
0.5000 mg | INTRAMUSCULAR | Status: DC | PRN
  Administered 2024-09-04 (×2): 1 mg via INTRAVENOUS
  Filled 2024-09-04 (×2): qty 1

## 2024-09-04 MED ORDER — HYDROMORPHONE HCL 1 MG/ML IJ SOLN
0.5000 mg | INTRAMUSCULAR | Status: DC | PRN
  Administered 2024-09-04 – 2024-09-05 (×5): 1 mg via INTRAVENOUS
  Filled 2024-09-04 (×5): qty 1

## 2024-09-04 MED ORDER — CARMEX CLASSIC LIP BALM EX OINT
TOPICAL_OINTMENT | CUTANEOUS | Status: DC | PRN
  Administered 2024-09-04: 1 via TOPICAL
  Filled 2024-09-04: qty 10

## 2024-09-04 MED ORDER — ONDANSETRON HCL 4 MG/2ML IJ SOLN: 4.0000 mg | Freq: Once | INTRAMUSCULAR | Status: DC | PRN

## 2024-09-04 MED ORDER — SODIUM CHLORIDE 0.9 % IV SOLN: INTRAVENOUS | Status: AC | PRN

## 2024-09-04 MED ORDER — PIPERACILLIN-TAZOBACTAM 3.375 G IVPB
3.3750 g | Freq: Three times a day (TID) | INTRAVENOUS | Status: DC
  Administered 2024-09-04 – 2024-09-07 (×9): 3.375 g via INTRAVENOUS
  Filled 2024-09-04 (×9): qty 50

## 2024-09-04 MED ORDER — LIDOCAINE HCL 1 % IJ SOLN
INTRAMUSCULAR | Status: AC
  Filled 2024-09-04: qty 10

## 2024-09-04 MED ORDER — GADOBUTROL 1 MMOL/ML IV SOLN
5.0000 mL | Freq: Once | INTRAVENOUS | Status: AC | PRN
  Administered 2024-09-04: 5 mL via INTRAVENOUS

## 2024-09-04 NOTE — Progress Notes (Signed)
" °   09/04/24 0914  TOC Brief Assessment  Insurance and Status Reviewed  Patient has primary care physician Yes  Home environment has been reviewed Home w/ family  Prior level of function: Independent  Prior/Current Home Services No current home services  Social Drivers of Health Review SDOH reviewed no interventions necessary  Readmission risk has been reviewed Yes  Transition of care needs no transition of care needs at this time    "

## 2024-09-04 NOTE — Procedures (Signed)
 "  Providing Compassionate, Quality Care - Together   Procedure: Ventriculoperitoneal Shunt Reprogram  Patient: Crystal Greene   Location: 1319/1319-01  Shunt Manufacturer/Model: Codman  Reason for Reprogramming: S/P MRI  Ending Setting: 120 mmHg  The patient tolerated the procedure well. The final setting was verified with x-ray.    MR PELVIS W WO CONTRAST Result Date: 09/04/2024 CLINICAL DATA:  Multiseptated left adnexal cystic lesion. Large volume ascites status post paracentesis EXAM: MRI PELVIS WITHOUT AND WITH CONTRAST TECHNIQUE: Multiplanar multisequence MR imaging of the pelvis was performed both before and after administration of intravenous contrast. CONTRAST:  5mL GADAVIST  GADOBUTROL  1 MMOL/ML IV SOLN COMPARISON:  CT abdomen and pelvis dated 09/03/2024 FINDINGS: Urinary Tract:  No abnormality visualized. Bowel:  Unremarkable visualized pelvic bowel loops. Vascular/Lymphatic: No pathologically enlarged lymph nodes. No significant vascular abnormality seen. Reproductive: Uterus measures 8.4 cm in sagittal dimension. The endometrium measures 10 mm. Low-lying intrauterine device is again seen within the lower uterine segment extending into the cervix. The arms of the IUD appear to extend beyond the cervical serosa (7:21, 22). T2 hypointense mass at the uterine fundus measures 2.2 x 1.5 x 1.6 cm (3:18, 9:27), likely leiomyoma. Right ovary (2:15) contains an intrinsically T1 hyperintense 9 x 7 mm ovoid focus (15:25). Nonenhancing Intrinsically T1 hyperintense left adnexal cystic structure measures 7.0 x 4.4 cm (15:25) and demonstrates internal layering fluid level. Within the medial aspect of this cystic structure, there are 2 heterogeneously T1 hyperintense rounded foci measuring 3.1 x 2.4 cm anteriorly (15:17) and 1.5 x 1.0 cm posteriorly (15:15). These lesions demonstrate marked diffusion restriction. No intrinsic fat signal. Other: Partially imaged ventriculoperitoneal shunt catheter.  Interval decreased small volume ascites status post paracentesis. The peritoneal ascites extends into the right posterior pelvis in a slightly serpentine fashion. Persistent diffuse peritoneal thickening and enhancement. Mild T2 hypointensity within the dependent posterior pelvis measures 1.9 x 1.2 cm (3:20), demonstrating diffusion restriction. Partially imaged fluid collection along the right paracolic gutter measures 3.0 x 2.0 cm (3:13), previously 4.8 x 1.6 cm. Musculoskeletal: No suspicious bone lesions identified. Mild body wall edema. IMPRESSION: 1. Nonenhancing left adnexal cystic structure measures 7.0 x 4.4 cm and demonstrates internal layering fluid level and contains 2 rounded foci which demonstrate marked diffusion restriction. Findings are favored to represent endometriomas with hemorrhage. Malignant degeneration is not excluded. 2. Right ovary contains an intrinsically T1 hyperintense 9 x 7 mm ovoid focus, likely an additional small endometrioma. 3. Findings of peritonitis with decreased small volume ascites status post paracentesis. Mild T2 hypointensity within the dependent posterior pelvis may represent layering infectious/inflammatory debris or an endometrial implant. 4. Low-lying intrauterine device within the lower uterine segment extending into the cervix. The arms of the IUD appear to extend beyond the cervical serosa. Electronically Signed   By: Limin  Xu M.D.   On: 09/04/2024 14:38   US  Paracentesis Result Date: 09/04/2024 INDICATION: Patient admitted with abdominal pain and distention, found to have new onset ascites concerning for possible malignancy. Request for diagnostic and therapeutic paracentesis. EXAM: ULTRASOUND GUIDED DIAGNOSTIC AND THERAPEUTIC PARACENTESIS MEDICATIONS: 8 mL 1% lidocaine  COMPLICATIONS: None immediate. PROCEDURE: Informed written consent was obtained from the patient's mother Breena Bevacqua after a discussion of the risks, benefits and alternatives to  treatment. A timeout was performed prior to the initiation of the procedure. Initial ultrasound scanning demonstrates a large amount of ascites with multiple septations within the right and left lower abdominal quadrants. The right lower abdomen was prepped and draped in the usual  sterile fashion. 1% lidocaine  was used for local anesthesia. Following this, a 19 gauge, 7-cm, Yueh catheter was introduced. An ultrasound image was saved for documentation purposes. The paracentesis was performed. The catheter was removed and a dressing was applied. The patient tolerated the procedure well without immediate post procedural complication. FINDINGS: A total of approximately 2.2 L of clear yellow fluid was removed. Samples were sent to the laboratory as requested by the clinical team. IMPRESSION: Successful ultrasound-guided paracentesis yielding 2.2 liters of peritoneal fluid. Electronically Signed   By: Cordella Banner   On: 09/04/2024 13:44   CT ABDOMEN PELVIS W CONTRAST Result Date: 09/03/2024 EXAM: CT ABDOMEN AND PELVIS WITH CONTRAST 09/03/2024 10:28:08 PM TECHNIQUE: CT of the abdomen and pelvis was performed with the administration of intravenous contrast. Multiplanar reformatted images are provided for review. Automated exposure control, iterative reconstruction, and/or weight-based adjustment of the mA/kV was utilized to reduce the radiation dose to as low as reasonably achievable. 75 mL of iohexol  (OMNIPAQUE ) 300 MG/ML solution was administered. COMPARISON: Ultrasound pelvis 09/03/2024, abdominal radiograph 09/03/2024, CT abdomen and pelvis 08/30/2024. CLINICAL HISTORY: Adnexal mass. FINDINGS: LOWER CHEST: Mild atelectasis in the lung bases. Developing pleural thickening with possible developing loculation of some of the fluid. LIVER: Low attenuation changes in the left lobe of the liver probably represent areas of focal fatty infiltration. No change since prior study. GALLBLADDER AND BILE DUCTS: Gallbladder, bile  ducts, and pancreas are unremarkable. SPLEEN: Spleen is normal. PANCREAS: Gallbladder, bile ducts, and pancreas are unremarkable. ADRENAL GLANDS: Adrenal glands are normal. KIDNEYS, URETERS AND BLADDER: Kidneys are normal. No stones in the kidneys or ureters. No hydronephrosis. No perinephric or periureteral stranding. Urinary bladder is unremarkable. GI AND BOWEL: The stomach, small bowel, and colon are not abnormally distended. Contrast material flows through to the rectum without evidence of obstruction. Appendix is not identified. PERITONEUM AND RETROPERITONEUM: Tubing demonstrated in the anterior chest/abdominal wall extending into the abdomen and pelvis consistent with ventriculoperitoneal shunt tubing. Large amount of fluid throughout the abdomen and pelvis, increasing since previous study. This may indicate peritonitis or possibly developing abscess. No free air. VASCULATURE: Normal caliber abdominal aorta. No aneurysm or dissection. LYMPH NODES: No lymphadenopathy. REPRODUCTIVE ORGANS: Nodular myometrial appearance consistent with fibroid. Intrauterine device is demonstrated in the lower uterine segment/endocervical region. Complex multiseptated cystic lesion in the left adnexa, measuring 6.2 cm in diameter. The left adnexal mass was seen on prior CT but not visualized at ultrasound. MRI is recommended for further evaluation. Malignancy with peritoneal metastatic disease is not excluded. BONES AND SOFT TISSUES: No acute osseous abnormality. No focal soft tissue abnormality. Tubing demonstrated in the anterior chest/abdominal wall extending into the abdomen and pelvis consistent with ventriculoperitoneal shunt tubing. IMPRESSION: 1. Complex multiseptated cystic lesion in the left adnexa measuring 6.2 cm, seen on prior CT but not visualized at ultrasound. MRI is recommended for further evaluation. Malignancy with peritoneal metastatic disease is not excluded. 2. Large amount of fluid throughout the abdomen  and pelvis, increasing since previous study, with developing peritoneal thickening and possible loculation, suggestive of peritonitis or developing abscess. Electronically signed by: Elsie Gravely MD 09/03/2024 10:44 PM EST RP Workstation: [id number]   US  PELVIC COMPLETE WITH TRANSVAGINAL Result Date: 09/03/2024 EXAM: US  Pelvis, Complete Transvaginal and Transabdominal without Doppler TECHNIQUE: Transabdominal and transvaginal pelvic duplex ultrasound using B-mode/gray scaled imaging without Doppler spectral analysis and color flow was obtained. COMPARISON: CT abdomen and pelvis from 08/30/2024. CLINICAL HISTORY: [id number] Ovarian mass, left [id number] Ovarian mass, left. FINDINGS:  UTERUS: Anteverted uterus measures 7.6 x 3.3 x 4.2 cm with a volume of 55.3 cc. Intramural fundal fibroid measures 1.3 x 1.2 x 2.0 cm. ENDOMETRIAL STRIPE: The endometrium measures 11 mm in thickness. IUD may be malpositioned within the lower uterine segment and cervical region. This appears similar to the CT findings from 08/30/2024. RIGHT OVARY: The right ovary is not visualized and is obscured by complex fluid and overlying bowel gas. LEFT OVARY: The left ovary is also not visualized and is obscured by complex fluid and overlying bowel gas. FREE FLUID: A large volume of fluid is identified within the pelvis, there are multiple areas of low-level internal echoes and multiple septations. On the CT from 08/30/2024 corresponding multifocal loculated fluid collections noted with a Ventriculoperitoneal shunt is in place. IMPRESSION: 1. Large volume of complex, septated pelvic fluid with low-level internal echoes, similar to prior CT, with the ovaries not visualized due to overlying fluid and bowel gas; The most likely differential diagnosis based on these findings includes peritonitis with multiple pelvic abscesses (possibly due to infected ventriculoperitoneal shunt tubing,) malignancy (e.g., ovarian cancer with ascites and peritoneal  carcinomatosis) and pelvic inflammatory disease (PID) with tubo-ovarian abscesses or pyosalpinx. Appropriate follow-up guidelines include correlation with clinical symptoms and laboratory findings (e.g., CA-125), further imaging such as a dedicated pelvic MRI for better characterization of the adnexal regions and the complex fluid collections, and consideration of fluid aspiration. 2. The IUD is malpositioned within the lower uterine segment and cervix. Electronically signed by: Waddell Calk MD 09/03/2024 09:18 AM EST RP Workstation: [id number]   DG Abdomen 1 View Result Date: 09/03/2024 EXAM: 1 VIEW XRAY OF THE ABDOMEN 09/03/2024 02:10:00 AM COMPARISON: 06/22/2022 CLINICAL HISTORY: distention, concern for early obstruction FINDINGS: BOWEL: Nonobstructive bowel gas pattern. Oral contrast material noted throughout the colon. SOFT TISSUES: IUD noted in the lower pelvis. VP shunt catheter terminates in the lower abdomen. No abnormal calcifications. BONES: No acute fracture. IMPRESSION: 1. No acute findings. Electronically signed by: Franky Crease MD 09/03/2024 02:21 AM EST RP Workstation: [id number]       Gerard Beck, DNP, AGNP-C Nurse Practitioner 09/04/2024 6:32 PM    [facility] [address], [place], [place] [address] P: [phone]    F: [phone]  "

## 2024-09-04 NOTE — Progress Notes (Signed)
" °   09/04/24 0607  Vitals  Temp (!) 97.4 F (36.3 C)  Temp Source Oral  BP (!) 139/100  MAP (mmHg) 111  BP Location Right Arm  BP Method Automatic  Patient Position (if appropriate) Lying  Pulse Rate (!) 117  Pulse Rate Source Monitor  Resp 17  MEWS COLOR  MEWS Score Color Yellow  Oxygen Therapy  SpO2 95 %  O2 Device Room Air  MEWS Score  MEWS Temp 0  MEWS Systolic 0  MEWS Pulse 2  MEWS RR 0  MEWS LOC 0  MEWS Score 2   Pain meds given.  "

## 2024-09-04 NOTE — Plan of Care (Signed)
 ?  Problem: Clinical Measurements: ?Goal: Will remain free from infection ?Outcome: Progressing ?  ?

## 2024-09-04 NOTE — Consult Note (Signed)
 I was contacted by Dr. Will regarding this patient.  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  She has had a laparoscopic revision of her shunt reportedly at [facility].  She was admitted yesterday with abdominal distention and ascites with an adnexal mass.  There is concern for malignancy.  She is going to be further worked up with a abdomen and pelvis MRI.  I reviewed her CT of the abdomen pelvis.  She has quite a bit of ascites.  I have discussed this with Dr. Alvia.  Occasionally a shunt infection can cause abdominal pseudocyst but this large amount of ascites is very atypical and more concerning for malignancy particularly with her history of adnexal mass.  I recommend that they proceed with any further workup for malignancy they feel indicated including a MRI.  Please contact us  once the MRI is done and we can reset her shunt.

## 2024-09-04 NOTE — Progress Notes (Signed)
 Chart review.  I personally reviewed the CT imaging from last night.  Reviewed labs.  Her ascites has increased since her last CT However there is no evidence of small bowel distention or obstruction There is still this left adnexal complex mass versus abscess  CA125 elevated at 39.4 otherwise tumor markers are negative LDH also elevated  From a general surgery standpoint I do not think there is anything for us  to offer - no radiological evidence of intestinal obstruction, contrast into rectum.  This appears to be a gynecological process of unknown etiology  Recommend paracentesis to sample the fluid/relieve some of the abdominal pressure Agree with MRI pelvis Agree with neurosurgery consultation to make sure that they do not think this is some type of VP shunt malfunction as well as to reset the shunt after MRI of the pelvis May be helpful to get paracentesis first to drain some of the fluid from her abdomen before getting the MRI pelvis which may help visualize some of the pelvic structures better D/w TRH  Daishia Fetterly M. Tanda, MD, FACS General, Bariatric, & Minimally Invasive Surgery [facility]

## 2024-09-04 NOTE — Progress Notes (Signed)
 " PROGRESS NOTE    Crystal Greene  FMW:[id number] DOB: 02-Feb-2001 DOA: 09/02/2024 PCP: Ozell Heron HERO, MD   Brief Narrative:  24 y.o. 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  Family brought her to the ER due to worsening abdominal distention and pain.  They denied any nausea vomiting.  Her last bowel movement was 4 days ago.   She does complain of urinary frequency and dysuria.  Has a history of frequent UTI. CT abdomen pelvis with contrast done on December 30 shows Distended small bowel with wall thickening and fold thickening, likely due to enteritis but could indicate early or partial small bowel obstruction. Contrast material is not to the colon yet. .Moderate diffuse abdominal and pelvic ascites, progressing since the prior study.Complex septated left adnexal cystic mass measuring 4.2 x 5.5 cm. Pelvic ultrasound is recommended for further evaluation.Low-position intrauterine device in the lower uterine segment/endocervical region. No history of decreased appetite or weight loss. ED Course: Received IV fluids morphine  and Rocephin .  Assessment & Plan:   Principal Problem:   Adnexal cyst Active Problems:   Ascites  #1  Abdominal pain/new onset ascites/adnexal mass initially she presented as possible partial small bowel  obstruction seen by surgery patient was passing gas.  A CT of the abdomen done 09/04/2024 does not show any evidence of obstruction.  She is status post 2 L of paracentesis.  Appreciate interventional radiology doing this today.  Follow-up on the studies culture cytology cell count.  Started on Zosyn  for concern for peritonitis versus abscesses by CT done today.  GYN following.  Patient started on a diet today.    # 2 VP Shunt no concern for infection at this time.  No complaints of fever neck pain headaches change in mental status confusion excetra.  Will monitor closely and consult neurosurgery if needed.  Discussed with Dr. Budge.  They will program the shunt today after the MRI.   # 3 UTI ua cw with uti with symptoms  Continue rocephin   urine culture with gram-negative rods sensitivity pending.   # 4 dehydration ua with ketones continue IVF   Estimated body mass index is 20.9 kg/m as calculated from the following:   Height as of this encounter: 5' (1.524 m).   Weight as of this encounter: 48.5 kg.  DVT prophylaxis: lovenox  Code Status: full Family Communication: dw family Disposition Plan:  Status is: Inpatient Remains inpatient appropriate because: acute illness   Consultants: surgery gyn IR   Procedures:paracentesis  Antimicrobials:zosyn   Subjective: Earlier when I saw her she was complaining of severe cramping pain on morphine  and Dilaudid  after paracentesis her pain improved MRI of the pelvis CT of the abdomen reviewed concern for peritonitis or abscess noted patient was started on Zosyn .  Objective: Vitals:   09/04/24 1059 09/04/24 1229 09/04/24 1245 09/04/24 1301  BP: (!) 138/106 (!) 131/91 (!) 130/93 129/88  Pulse: (!) 124  Resp: 14     Temp: 99.2 F (37.3 C)     TempSrc: Oral     SpO2: 92%     Weight:      Height:        Intake/Output Summary (Last 24 hours) at 09/04/2024 1703 Last data filed at 09/04/2024 1400 Gross per 24 hour  Intake 2879.84 ml  Output  500 ml  Net 2379.84 ml   Filed Weights   09/02/24 1814  Weight: 48.5 kg    Examination:  General exam: Appears calm and comfortable  Respiratory system: Clear to auscultation. Respiratory effort normal. Cardiovascular system: S1 & S2 heard, RRR. No JVD, murmurs, rubs, gallops or clicks. No pedal edema. Gastrointestinal system: Abdomen is nondistended, soft and nontender. No organomegaly or masses felt. Normal bowel sounds heard. Central nervous system: Alert and oriented. No focal neurological deficits. Extremities: Symmetric 5 x 5 power. Skin: No rashes, lesions or ulcers Psychiatry: Judgement and insight appear normal. Mood & affect appropriate.     Data Reviewed: I have personally reviewed following labs and imaging studies  CBC: Recent Labs  Lab 09/02/24 1817 09/03/24 0518 09/03/24 1341 09/04/24 0513  WBC 11.9* 10.7* 11.2* 13.0*  HGB 10.3* 9.0* 10.2* 10.2*  HCT 31.3* 26.2* 31.4* 31.9*  MCV 79.0* 78.4* 79.5* 80.6  PLT 586* 500* 610* 594*   Basic Metabolic Panel: Recent Labs  Lab 09/02/24 1817 09/03/24 0518 09/03/24 1341 09/04/24 0513  NA 136 135  --  137  K 3.6 3.6  --  3.9  CL 97* 101  --  101  CO2 24 23  --  20*  GLUCOSE 83 87  --  82  BUN 16 11  --  8  CREATININE 0.67 0.57 0.69 0.62  CALCIUM  9.8 9.0  --  9.5   GFR: Estimated Creatinine Clearance: 79.2 mL/min (by C-G formula based on SCr of 0.62 mg/dL). Liver Function Tests: Recent Labs  Lab 09/02/24 1817 09/03/24 0518 09/04/24 0513  AST 11* 11* 23  ALT 9 9 14   ALKPHOS 108 82 108  BILITOT 0.4 0.3 0.4  PROT 8.0 6.9 7.6  ALBUMIN 3.5 2.8* 3.0*   Recent Labs  Lab 09/02/24 1817  LIPASE 28   No results for input(s): AMMONIA in the last 168 hours. Coagulation Profile: No results for input(s): INR, PROTIME in the last 168 hours. Cardiac Enzymes: No results for input(s): CKTOTAL, CKMB, CKMBINDEX, TROPONINI in the last 168 hours. BNP (last 3 results) No results for input(s):  PROBNP in the last 8760 hours. HbA1C: No results for input(s): HGBA1C in the last 72 hours. CBG: No results for input(s): GLUCAP in the last 168 hours. Lipid Profile: No results for input(s): CHOL, HDL, LDLCALC, TRIG, CHOLHDL, LDLDIRECT in the last 72 hours. Thyroid Function Tests: No results for input(s): TSH, T4TOTAL, FREET4, T3FREE, THYROIDAB in the last 72 hours. Anemia Panel: No results for input(s): VITAMINB12, FOLATE, FERRITIN, TIBC, IRON, RETICCTPCT in the last 72 hours. Sepsis Labs: No results for input(s): PROCALCITON, LATICACIDVEN in the last 168 hours.  Recent Results (from the past 240 hours)  Urine Culture     Status: Abnormal (Preliminary result)   Collection Time: 09/03/24 12:08 AM   Specimen: Urine, Clean Catch  Result Value Ref Range Status   Specimen Description   Final    URINE, CLEAN CATCH Performed at [facility], [address] [place], [place] [address]    Special Requests   Final    NONE Performed at [facility], [address], [place], [place] [address]    Culture (A)  Final    60,000 COLONIES/mL VONNE NEGATIVE RODS SUSCEPTIBILITIES TO FOLLOW Performed at [facility], [address] [place], [place] [address]    Report Status PENDING  Incomplete         Radiology Studies: MR PELVIS W WO CONTRAST Result Date: 09/04/2024 CLINICAL DATA:  Multiseptated left adnexal cystic lesion. Large volume ascites status post paracentesis EXAM: MRI PELVIS WITHOUT AND WITH CONTRAST TECHNIQUE: Multiplanar multisequence MR imaging of the pelvis was performed both before and after administration of intravenous contrast. CONTRAST:  5mL GADAVIST  GADOBUTROL  1 MMOL/ML IV SOLN COMPARISON:  CT abdomen and pelvis dated 09/03/2024 FINDINGS: Urinary Tract:  No abnormality visualized. Bowel:  Unremarkable visualized pelvic bowel loops. Vascular/Lymphatic: No pathologically enlarged lymph  nodes. No significant vascular abnormality seen. Reproductive: Uterus measures 8.4 cm in sagittal dimension. The endometrium measures 10 mm. Low-lying intrauterine device is again seen within the lower uterine segment extending into the cervix. The arms of the IUD appear to extend beyond the cervical serosa (7:21, 22). T2 hypointense mass at the uterine fundus measures 2.2 x 1.5 x 1.6 cm (3:18, 9:27), likely leiomyoma. Right ovary (2:15) contains an intrinsically T1 hyperintense 9 x 7 mm ovoid focus (15:25). Nonenhancing Intrinsically T1 hyperintense left adnexal cystic structure measures 7.0 x 4.4 cm (15:25) and demonstrates internal layering fluid level. Within the medial aspect of this cystic structure, there are 2 heterogeneously T1 hyperintense rounded foci measuring 3.1 x 2.4 cm anteriorly (15:17) and 1.5 x 1.0 cm posteriorly (15:15). These lesions demonstrate marked diffusion restriction. No intrinsic fat signal. Other: Partially imaged ventriculoperitoneal shunt catheter. Interval decreased small volume ascites status post paracentesis. The peritoneal ascites extends into the right posterior pelvis in a slightly serpentine fashion. Persistent diffuse peritoneal thickening and enhancement. Mild T2 hypointensity within the dependent posterior pelvis measures 1.9 x 1.2 cm (3:20), demonstrating diffusion restriction. Partially imaged fluid collection along the right paracolic gutter measures 3.0 x 2.0 cm (3:13), previously 4.8 x 1.6 cm. Musculoskeletal: No suspicious bone lesions identified. Mild body wall edema. IMPRESSION: 1. Nonenhancing left adnexal cystic structure measures 7.0 x 4.4 cm and demonstrates internal layering fluid level and contains 2 rounded foci which demonstrate marked diffusion restriction. Findings are favored to represent endometriomas with hemorrhage. Malignant degeneration is not excluded. 2. Right ovary contains an intrinsically T1 hyperintense 9 x 7 mm ovoid focus, likely an  additional small endometrioma. 3. Findings of peritonitis with decreased small volume ascites status post paracentesis. Mild T2 hypointensity within the dependent posterior pelvis may represent layering infectious/inflammatory debris or an endometrial implant. 4. Low-lying intrauterine device within the lower uterine segment extending into the cervix. The arms of the IUD appear to extend beyond the cervical serosa. Electronically Signed   By: Limin  Xu M.D.   On: 09/04/2024 14:38   US  Paracentesis Result Date: 09/04/2024 INDICATION: Patient admitted with abdominal pain and distention, found to have new onset ascites concerning for possible malignancy. Request for diagnostic and therapeutic paracentesis. EXAM: ULTRASOUND GUIDED DIAGNOSTIC AND THERAPEUTIC PARACENTESIS MEDICATIONS: 8 mL 1% lidocaine  COMPLICATIONS: None immediate. PROCEDURE: Informed written consent was obtained from the patient's mother Morgan Keinath after a discussion of the risks, benefits and alternatives to treatment. A timeout was performed prior to the initiation of the procedure. Initial ultrasound scanning demonstrates a large amount of ascites with multiple septations within the right and left lower abdominal quadrants. The right lower abdomen was prepped and draped in  the usual sterile fashion. 1% lidocaine  was used for local anesthesia. Following this, a 19 gauge, 7-cm, Yueh catheter was introduced. An ultrasound image was saved for documentation purposes. The paracentesis was performed. The catheter was removed and a dressing was applied. The patient tolerated the procedure well without immediate post procedural complication. FINDINGS: A total of approximately 2.2 L of clear yellow fluid was removed. Samples were sent to the laboratory as requested by the clinical team. IMPRESSION: Successful ultrasound-guided paracentesis yielding 2.2 liters of peritoneal fluid. Electronically Signed   By: Cordella Banner   On: 09/04/2024 13:44    CT ABDOMEN PELVIS W CONTRAST Result Date: 09/03/2024 EXAM: CT ABDOMEN AND PELVIS WITH CONTRAST 09/03/2024 10:28:08 PM TECHNIQUE: CT of the abdomen and pelvis was performed with the administration of intravenous contrast. Multiplanar reformatted images are provided for review. Automated exposure control, iterative reconstruction, and/or weight-based adjustment of the mA/kV was utilized to reduce the radiation dose to as low as reasonably achievable. 75 mL of iohexol  (OMNIPAQUE ) 300 MG/ML solution was administered. COMPARISON: Ultrasound pelvis 09/03/2024, abdominal radiograph 09/03/2024, CT abdomen and pelvis 08/30/2024. CLINICAL HISTORY: Adnexal mass. FINDINGS: LOWER CHEST: Mild atelectasis in the lung bases. Developing pleural thickening with possible developing loculation of some of the fluid. LIVER: Low attenuation changes in the left lobe of the liver probably represent areas of focal fatty infiltration. No change since prior study. GALLBLADDER AND BILE DUCTS: Gallbladder, bile ducts, and pancreas are unremarkable. SPLEEN: Spleen is normal. PANCREAS: Gallbladder, bile ducts, and pancreas are unremarkable. ADRENAL GLANDS: Adrenal glands are normal. KIDNEYS, URETERS AND BLADDER: Kidneys are normal. No stones in the kidneys or ureters. No hydronephrosis. No perinephric or periureteral stranding. Urinary bladder is unremarkable. GI AND BOWEL: The stomach, small bowel, and colon are not abnormally distended. Contrast material flows through to the rectum without evidence of obstruction. Appendix is not identified. PERITONEUM AND RETROPERITONEUM: Tubing demonstrated in the anterior chest/abdominal wall extending into the abdomen and pelvis consistent with ventriculoperitoneal shunt tubing. Large amount of fluid throughout the abdomen and pelvis, increasing since previous study. This may indicate peritonitis or possibly developing abscess. No free air. VASCULATURE: Normal caliber abdominal aorta. No aneurysm or  dissection. LYMPH NODES: No lymphadenopathy. REPRODUCTIVE ORGANS: Nodular myometrial appearance consistent with fibroid. Intrauterine device is demonstrated in the lower uterine segment/endocervical region. Complex multiseptated cystic lesion in the left adnexa, measuring 6.2 cm in diameter. The left adnexal mass was seen on prior CT but not visualized at ultrasound. MRI is recommended for further evaluation. Malignancy with peritoneal metastatic disease is not excluded. BONES AND SOFT TISSUES: No acute osseous abnormality. No focal soft tissue abnormality. Tubing demonstrated in the anterior chest/abdominal wall extending into the abdomen and pelvis consistent with ventriculoperitoneal shunt tubing. IMPRESSION: 1. Complex multiseptated cystic lesion in the left adnexa measuring 6.2 cm, seen on prior CT but not visualized at ultrasound. MRI is recommended for further evaluation. Malignancy with peritoneal metastatic disease is not excluded. 2. Large amount of fluid throughout the abdomen and pelvis, increasing since previous study, with developing peritoneal thickening and possible loculation, suggestive of peritonitis or developing abscess. Electronically signed by: Elsie Gravely MD 09/03/2024 10:44 PM EST RP Workstation: [id number]   US  PELVIC COMPLETE WITH TRANSVAGINAL Result Date: 09/03/2024 EXAM: US  Pelvis, Complete Transvaginal and Transabdominal without Doppler TECHNIQUE: Transabdominal and transvaginal pelvic duplex ultrasound using B-mode/gray scaled imaging without Doppler spectral analysis and color flow was obtained. COMPARISON: CT abdomen and pelvis from 08/30/2024. CLINICAL HISTORY: [id number] Ovarian mass, left [id number] Ovarian mass,  left. FINDINGS: UTERUS: Anteverted uterus measures 7.6 x 3.3 x 4.2 cm with a volume of 55.3 cc. Intramural fundal fibroid measures 1.3 x 1.2 x 2.0 cm. ENDOMETRIAL STRIPE: The endometrium measures 11 mm in thickness. IUD may be malpositioned within the lower uterine  segment and cervical region. This appears similar to the CT findings from 08/30/2024. RIGHT OVARY: The right ovary is not visualized and is obscured by complex fluid and overlying bowel gas. LEFT OVARY: The left ovary is also not visualized and is obscured by complex fluid and overlying bowel gas. FREE FLUID: A large volume of fluid is identified within the pelvis, there are multiple areas of low-level internal echoes and multiple septations. On the CT from 08/30/2024 corresponding multifocal loculated fluid collections noted with a Ventriculoperitoneal shunt is in place. IMPRESSION: 1. Large volume of complex, septated pelvic fluid with low-level internal echoes, similar to prior CT, with the ovaries not visualized due to overlying fluid and bowel gas; The most likely differential diagnosis based on these findings includes peritonitis with multiple pelvic abscesses (possibly due to infected ventriculoperitoneal shunt tubing,) malignancy (e.g., ovarian cancer with ascites and peritoneal carcinomatosis) and pelvic inflammatory disease (PID) with tubo-ovarian abscesses or pyosalpinx. Appropriate follow-up guidelines include correlation with clinical symptoms and laboratory findings (e.g., CA-125), further imaging such as a dedicated pelvic MRI for better characterization of the adnexal regions and the complex fluid collections, and consideration of fluid aspiration. 2. The IUD is malpositioned within the lower uterine segment and cervix. Electronically signed by: Waddell Calk MD 09/03/2024 09:18 AM EST RP Workstation: [id number]   DG Abdomen 1 View Result Date: 09/03/2024 EXAM: 1 VIEW XRAY OF THE ABDOMEN 09/03/2024 02:10:00 AM COMPARISON: 06/22/2022 CLINICAL HISTORY: distention, concern for early obstruction FINDINGS: BOWEL: Nonobstructive bowel gas pattern. Oral contrast material noted throughout the colon. SOFT TISSUES: IUD noted in the lower pelvis. VP shunt catheter terminates in the lower abdomen. No abnormal  calcifications. BONES: No acute fracture. IMPRESSION: 1. No acute findings. Electronically signed by: Kevin Dover MD 09/03/2024 02:21 AM EST RP Workstation: [id number]     Scheduled Meds:  enoxaparin  (LOVENOX ) injection  30 mg Subcutaneous Daily   Continuous Infusions:  sodium chloride  100 mL/hr at 09/04/24 0942   sodium chloride  10 mL/hr at 09/04/24 1530   piperacillin -tazobactam (ZOSYN )  IV 3.375 g (09/04/24 1522)     LOS: 1 day    Almarie KANDICE Hoots, MD 09/04/2024, 5:03 PM   "

## 2024-09-04 NOTE — Procedures (Signed)
 PROCEDURE SUMMARY:  Successful image-guided paracentesis from the right lower abdomen.  Yielded 2.2 liters of clear yellow fluid.  No immediate complications.  EBL < 1 mL. Patient tolerated well.   Specimen was sent for labs.  Please see imaging section of Epic for full dictation.  Clotilda DELENA Hesselbach PA-C 09/04/2024 12:47 PM

## 2024-09-04 NOTE — Progress Notes (Signed)
 Reviewed patient imaging and chart again to date.   I reviewed her CT and MRI images personally with regards to GYN pathology.  - Her CT from 1/3: showed large volume of ascites and possible left adnexal cyst. She has an IUD and position does appear low. The VP shunt appears to pass directly through the ascites. She does not have evidence of carcinomatosis.  - Her MRI from 1/4: The IUD appears to be in the LUS with possible embedding of the arms, seen best on Axial T2, MR#3, image 23/47. She has a left complex adnexal cyst that has thin septations. This structure could represent endometrioma versus serous cystadenoma. The fluid within the larger aspect of the total cyst does show internal layering. Right ovary appears normal.   Her labs are as follows: - CA 19-9: 11 - AFP: <1.8 - CEA: 0.8 - LDH: 85 - CA 125: 39.4  Her cytology and other studies from the paracentesis today is pending.   Assessment: - It is statistically unlikely that this patient has epithelial ovarian carcinoma as her risk of this is extremely rare. The incidence of EOC is about 10/100,000 and only 4% of these are women under the age of 23 (even less when <20 which she is even closer to). In the absence of carcinomatosis this is even more unlikely. Given her age and the absence of carcinomatosis, a CA 125 of 39.4 is NORMAL.  - We recommend awaiting studies and input from neurology and not assuming this is ovarian cancer. We will continue to follow along and discuss this with family tomorrow when possible.  - In terms of IUD, this will need to be removed and replaced as an outpatient.  - As for the cyst, would recommend outpatient follow up with a [facility]IG[facility] surgeon (I.e. [facility] has them) for possible ovarian endometrioma for cystectomy. Importantly, an endometrioma would not be a cause of ascites.   Vina Solian, MD Attending Obstetrician & Gynecologist, [facility] for [facility], [facility]

## 2024-09-05 ENCOUNTER — Inpatient Hospital Stay (HOSPITAL_COMMUNITY)

## 2024-09-05 ENCOUNTER — Ambulatory Visit: Payer: Self-pay | Admitting: Family Medicine

## 2024-09-05 DIAGNOSIS — N390 Urinary tract infection, site not specified: Secondary | ICD-10-CM

## 2024-09-05 DIAGNOSIS — F17290 Nicotine dependence, other tobacco product, uncomplicated: Secondary | ICD-10-CM

## 2024-09-05 DIAGNOSIS — B9620 Unspecified Escherichia coli [E. coli] as the cause of diseases classified elsewhere: Secondary | ICD-10-CM | POA: Diagnosis not present

## 2024-09-05 DIAGNOSIS — N949 Unspecified condition associated with female genital organs and menstrual cycle: Secondary | ICD-10-CM | POA: Diagnosis not present

## 2024-09-05 LAB — LD, BODY FLUID (OTHER): LD, Body Fluid: 261 IU/L

## 2024-09-05 LAB — URINE CULTURE: Culture: 60000 — AB

## 2024-09-05 LAB — URINE CYTOLOGY ANCILLARY ONLY
Chlamydia: NEGATIVE
Comment: NEGATIVE
Comment: NORMAL
Neisseria Gonorrhea: NEGATIVE

## 2024-09-05 LAB — GLUCOSE, BODY FLUID OTHER: Glucose, Body Fluid Other: 55 mg/dL

## 2024-09-05 MED ORDER — HYDROMORPHONE HCL 1 MG/ML IJ SOLN
1.0000 mg | INTRAMUSCULAR | Status: DC | PRN
  Administered 2024-09-05 – 2024-09-07 (×13): 1 mg via INTRAVENOUS
  Filled 2024-09-05 (×13): qty 1

## 2024-09-05 NOTE — Hospital Course (Signed)
 23yo with h/o prematurity with hydrocephalus and IVH requiring VP shunt  with multiple revisions and insertion of peritoneal catheter who presented on 1/2 with abdominal pain and distention.  12/30 CT with possible early SBO, but this appears to have been ruled out.  + ascites.  4.2 c 5.5 cm L adnexal cystic mass.  Pelvic US  on 1/3 with large volume of complex, septated pelvic fluid.  IUD in place.  Similar findings on 1/3 CT.  Paracentesis performed on 1/4 with 2.2L clear yellow fluid removed; repeat paracentesis on 1/5 was deferred due to lack of clear pocket.  MRI on 1/4 with nonenhancing L adnexal cystic structure 7 x 4.4 cm with layering, possible endometriomas with hemorrhage.  Also with peritonitis with small volume ascites.  Surgery consulted, has signed off.  ID consulted for probable peritonitis with infected ascites and E coli UTI; treating with Zosyn .  Neurosurgery consulted and thinks she needs externalization of her peritoneal shunt catheter, further investigation and treatment of the source of her abdominal and pelvic abnormalities, and eventual revision of her shunt; he recommends transfer to [facility] and this is in process.  GYN also consulted and confirms endometrioma (benign) with plan for outpatient f/u for replacement of Mirena IUD.

## 2024-09-05 NOTE — Progress Notes (Signed)
 Patient ID: Crystal Greene, female   DOB: [birth year], 24 y.o.   MRN: [id number] Pt returned to US  today for repeat paracentesis(therapeutic only)- last done yesterday. On limited US  abd in all four quadrants there is small amount of ascites RLQ /deep pelvic regions which is complex /multiseptated/loculated with bowel loops in close proximity.  Findings d/w pt/family and decision made to hold on paracentesis today.

## 2024-09-05 NOTE — Consult Note (Addendum)
 Reason for Consult: Peritonitis Referring Physician: Dr. Will Lincoln Deuser is an 24 y.o. female.  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  Her history is provided by the patient, her mother and grandmother.  She has had a shunt placed at [facility].  She has had a multiple revisions by Dr. Clarence in 2015 including prolonged externalization of her shunt, revisions with ventriculopleural and ventricular atrial shunts.  Eventually had a right sided shunt removed and a new Codman programmable ventriculoperitoneal shunt placed on the left.  She has not had any problems with her shunt since then.  She presented complaining of abdominal pain.  Serial abdominal and pelvic CT scans progressive peritoneal fluid.  She had a paracentesis on 09/04/2024.  This demonstrated 738 nucleated cells.  Blood cultures thus far have been negative.  She had a abdominal pelvic MRI which demonstrated adnexal masses.  We reset her shunt yesterday after her MRI.  Presently the patient is alert.  She appears uncomfortable.  She complains exclusively of abdominal pain.  Her grandmother is at the bedside.  Her mother is on the speaker phone.  She denies headaches, meningismus, excetra.  Past Medical History:  Diagnosis Date   Chronic lung disease    H/O prematurity 05/19/2013   24 wks PPROM and vaginal delivery; originally twin gestation with pregnancy complicated by a single fetal demise.     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    Stroke [facility])     Past Surgical History:  Procedure Laterality Date   EYE SURGERY     LAPAROSCOPIC REVISION VENTRICULAR-PERITONEAL (V-P) SHUNT     OVARY SURGERY     UMBILICAL HERNIA REPAIR     VENTRICULO-PERITONEAL SHUNT PLACEMENT / LAPAROSCOPIC INSERTION PERITONEAL CATHETER      Family History  Problem Relation Age of Onset    Other Mother        Alpha Thalassemia   Asthma Mother    Pneumonia Maternal Grandfather        died at age 90   High blood pressure Paternal Grandmother    Heart Problems Paternal Grandmother    Bleeding Disorder Paternal Grandmother    Heart attack Paternal Grandmother     Social History:  reports that she has never smoked. She has never used smokeless tobacco. She reports that she does not currently use alcohol. She reports that she does not currently use drugs after having used the following drugs: Marijuana.  Allergies: Allergies[1]  Medications: I have reviewed the patient's current medications. Prior to Admission:  Medications Prior to Admission  Medication Sig Dispense Refill Last Dose/Taking   EPINEPHrine  (EPIPEN  2-PAK) 0.3 mg/0.3 mL IJ SOAJ injection INJECT 0.3MLS INTO THE MUSCLE ONCE in case of anaphylaxis 2 each 0 Unknown   ibuprofen  (ADVIL ) 600 MG tablet Take 1 tablet (600 mg total) by mouth every 6 (six) hours as needed. 10 tablet 0 Unknown   Scheduled:  enoxaparin  (LOVENOX ) injection  30 mg Subcutaneous Daily   Continuous:  sodium chloride  100 mL/hr at 09/05/24 9361   sodium chloride  Stopped (09/05/24 0547)   piperacillin -tazobactam (ZOSYN )  IV 12.5 mL/hr at 09/05/24 0550   PRN:sodium chloride , HYDROmorphone  (DILAUDID ) injection, lip balm, morphine  injection, ondansetron  (ZOFRAN ) IV Anti-infectives (From admission, onward)    Start     Dose/Rate Route Frequency Ordered Stop   09/04/24  1300  piperacillin -tazobactam (ZOSYN ) IVPB 3.375 g        3.375 g 12.5 mL/hr over 240 Minutes Intravenous Every 8 hours 09/04/24 0946     09/04/24 0600  cefTRIAXone  (ROCEPHIN ) 1 g in sodium chloride  0.9 % 100 mL IVPB  Status:  Discontinued        1 g 200 mL/hr over 30 Minutes Intravenous Every 24 hours 09/03/24 1331 09/04/24 0945   09/03/24 0230  cefTRIAXone  (ROCEPHIN ) 1 g in sodium chloride  0.9 % 100 mL IVPB        1 g 200 mL/hr over 30 Minutes Intravenous  Once 09/03/24 0225  09/03/24 0313        Results for orders placed or performed during the hospital encounter of 09/02/24 (from the past 48 hours)  HIV Antibody (routine testing w rflx)     Status: None   Collection Time: 09/03/24  1:41 PM  Result Value Ref Range   HIV Screen 4th Generation wRfx Non Reactive Non Reactive    Comment: Performed at [facility], [address] [place], [place] [address]  CBC     Status: Abnormal   Collection Time: 09/03/24  1:41 PM  Result Value Ref Range   WBC 11.2 (H) 4.0 - 10.5 K/uL   RBC 3.95 3.87 - 5.11 MIL/uL   Hemoglobin 10.2 (L) 12.0 - 15.0 g/dL   HCT 68.5 (L) 63.9 - 53.9 %   MCV 79.5 (L) 80.0 - 100.0 fL   MCH 25.8 (L) 26.0 - 34.0 pg   MCHC 32.5 30.0 - 36.0 g/dL   RDW 84.9 88.4 - 84.4 %   Platelets 610 (H) 150 - 400 K/uL   nRBC 0.0 0.0 - 0.2 %    Comment: Performed at [facility], [address], [place], [place] [address]  Creatinine, serum     Status: None   Collection Time: 09/03/24  1:41 PM  Result Value Ref Range   Creatinine, Ser 0.69 0.44 - 1.00 mg/dL   GFR, Estimated >39 >39 mL/min    Comment: (NOTE) Calculated using the CKD-EPI Creatinine Equation (2021) Performed at [facility], [address] [place], [place] [address]   CEA     Status: None   Collection Time: 09/03/24  1:41 PM  Result Value Ref Range   CEA 0.8 0.0 - 4.7 ng/mL    Comment: (NOTE)                             Nonsmokers          <3.9                             Smokers             <5.6 [employer] Electrochemiluminescence Immunoassay (ECLIA) Values obtained with different assay methods or kits cannot be used interchangeably.  Results cannot be interpreted as absolute evidence of the presence or absence of malignant disease. Performed At: [facility] [address] [place], [place] [id number] Jennette Shorter MD Ey:[id number]   AFP tumor marker     Status: None   Collection Time: 09/03/24  1:41 PM  Result  Value Ref Range   AFP, Serum, Tumor Marker <1.8 0.0 - 4.7 ng/mL    Comment: (NOTE) [employer] Electrochemiluminescence Immunoassay (ECLIA) Values obtained with different assay methods or kits cannot be used interchangeably.  Results  cannot be interpreted as absolute evidence of the presence or absence of malignant disease. This test is not interpretable in pregnant females. Performed At: [facility] [address] [place], [place] [id number] Jennette Shorter MD Ey:[id number]   Cancer antigen 19-9     Status: None   Collection Time: 09/03/24  2:59 PM  Result Value Ref Range   CA 19-9 11 0 - 35 U/mL    Comment: (NOTE) [employer] Electrochemiluminescence Immunoassay (ECLIA) Values obtained with different assay methods or kits cannot be used interchangeably.  Results cannot be interpreted as absolute evidence of the presence or absence of malignant disease. Performed At: [facility] [address] [place], [place] [id number] Jennette Shorter MD Ey:[id number]   Lactate dehydrogenase     Status: Abnormal   Collection Time: 09/03/24  2:59 PM  Result Value Ref Range   LDH 85 (L) 105 - 235 U/L    Comment: Performed at [facility], [address], [place], [place] [address]  Comprehensive metabolic panel     Status: Abnormal   Collection Time: 09/04/24  5:13 AM  Result Value Ref Range   Sodium 137 135 - 145 mmol/L   Potassium 3.9 3.5 - 5.1 mmol/L   Chloride 101 98 - 111 mmol/L   CO2 20 (L) 22 - 32 mmol/L   Glucose, Bld 82 70 - 99 mg/dL    Comment: Glucose reference range applies only to samples taken after fasting for at least 8 hours.   BUN 8 6 - 20 mg/dL   Creatinine, Ser 9.37 0.44 - 1.00 mg/dL   Calcium  9.5 8.9 - 10.3 mg/dL   Total Protein 7.6 6.5 - 8.1 g/dL   Albumin 3.0 (L) 3.5 - 5.0 g/dL   AST 23 15 - 41 U/L   ALT 14 0 - 44 U/L   Alkaline Phosphatase 108 38 - 126 U/L   Total Bilirubin 0.4 0.0 - 1.2 mg/dL   GFR, Estimated >39  >39 mL/min    Comment: (NOTE) Calculated using the CKD-EPI Creatinine Equation (2021)    Anion gap 16 (H) 5 - 15    Comment: Performed at [facility], [address], [place], [place] [address]  CBC     Status: Abnormal   Collection Time: 09/04/24  5:13 AM  Result Value Ref Range   WBC 13.0 (H) 4.0 - 10.5 K/uL   RBC 3.96 3.87 - 5.11 MIL/uL   Hemoglobin 10.2 (L) 12.0 - 15.0 g/dL   HCT 68.0 (L) 63.9 - 53.9 %   MCV 80.6 80.0 - 100.0 fL   MCH 25.8 (L) 26.0 - 34.0 pg   MCHC 32.0 30.0 - 36.0 g/dL   RDW 84.7 88.4 - 84.4 %   Platelets 594 (H) 150 - 400 K/uL   nRBC 0.0 0.0 - 0.2 %    Comment: Performed at [facility], [address] [place], [place] [address]  Body fluid cell count with differential     Status: Abnormal   Collection Time: 09/04/24 12:48 PM  Result Value Ref Range   Fluid Type-FCT CYTO PERI    Color, Fluid YELLOW (A) YELLOW   Appearance, Fluid CLEAR CLEAR   Total Nucleated Cell Count, Fluid 738 0 - 1,000 cu mm   Neutrophil Count, Fluid 92 (H) 0 - 25 %   Lymphs, Fluid 3 %   Monocyte-Macrophage-Serous Fluid 5 (L) 50 - 90 %   Eos, Fluid 0 %    Comment: Performed at  [facility], [address] [place], [place] [address]  Body fluid culture w Gram Stain     Status: None (Preliminary result)   Collection Time: 09/04/24 12:48 PM   Specimen: PATH Cytology Peritoneal fluid  Result Value Ref Range   Specimen Description      PERITONEAL Performed at [facility], [address] [place], [place] [address]    Special Requests      NONE Performed at [facility], [address], [place], [place] [address]    Gram Stain NO WBC SEEN NO ORGANISMS SEEN     Culture      NO GROWTH < 24 HOURS Performed at [facility], [address] [place], [place] [address]    Report Status PENDING   LD, Body Fluid (other)     Status: None   Collection Time: 09/04/24 12:48 PM  Result  Value Ref Range   Source of Sample PERITONEAL     Comment: Performed at [facility], [address], [place], [place] [address]   LD, Body Fluid 261 IU/L    Comment: (NOTE)             _________________________________________            : BODY FLUID TYPE :          LDH          :            :_________________:_______________________:            :                 : Nonmalignant:  < 60%  :            :                 :  of the serum LDH     :            : Ascitic Fluid   : Malignant:     > 60%  :            :                 :  of the serum LDH     :            :_________________:_______________________:            : Gastric Juice   :                < 35   :            :_________________:_______________________:            :                 : Transudate:    <200   :            : Pleural Fluid   : Exudate:       >200   :            :_________________:_______________________:            : Saliva          :           113 - 609   :            : (Mixed Glands)  :                       :            :  _________________:_______________________:            : Synovial Fluid  :                <240   :            :_________________:___________ ____________:             Bronwen LELON Gwynn ALONSO Reference Intervals             for Adults and Children 2008. Ninth             Edition (V9.1) [employer],             [place]; [place]: July 2009. Performed At: [facility] [address] [place], [place] [id number] Jennette Shorter MD Ey:[id number]   Glucose, Body Fluid Other     Status: None   Collection Time: 09/04/24 12:48 PM  Result Value Ref Range   Glucose, Body Fluid Other 55 mg/dL    Comment: (NOTE)             _________________________________________            : BODY FLUID TYPE :        GLUCOSE        :            :_________________:_______________________:            : Amniotic Fluid  :        45 - 76        :             :_________________:_______________________:            : Bile, Clear     :            < 5        :            :_________________:_______________________:            : Bile, Yellow    :            < 8        :            :_________________:_______________________:            : Lymph           :        48 - 200       :            :_________________:_______________________:            : Nasal Secretion :            < 10       :            :_________________:_______________________:            : Pleural Fluid   :        65 -  99       :            :_________________:_______________________:            : Saliva          :            <  2       :            : (Mixed Glands)  :                       :            :  _________________:___________ ____________:            : Sweat           :            <  7       :            :_________________:_______________________:            : Synovial Fluid  :        65 -  99       :            :_________________:_______________________:            : Tears           :        76 - 288       :            :_________________:_______________________:             Bronwen ORN, Ehrhardt V. Reference Intervals             for Adults and Children 2008. Ninth             edition (V9.1) [employer],             [place]; [place]: July 2009. Performed At: [facility] [address] [place], [place] [id number] Jennette Shorter MD Ey:[id number]    Source of Sample PERITONEAL     Comment: Performed at [facility], [address] [place], [place] [address]    MR PELVIS W WO CONTRAST Result Date: 09/04/2024 CLINICAL DATA:  Multiseptated left adnexal cystic lesion. Large volume ascites status post paracentesis EXAM: MRI PELVIS WITHOUT AND WITH CONTRAST TECHNIQUE: Multiplanar multisequence MR imaging of the pelvis was performed both before and after administration of intravenous contrast. CONTRAST:  5mL GADAVIST  GADOBUTROL  1 MMOL/ML IV SOLN  COMPARISON:  CT abdomen and pelvis dated 09/03/2024 FINDINGS: Urinary Tract:  No abnormality visualized. Bowel:  Unremarkable visualized pelvic bowel loops. Vascular/Lymphatic: No pathologically enlarged lymph nodes. No significant vascular abnormality seen. Reproductive: Uterus measures 8.4 cm in sagittal dimension. The endometrium measures 10 mm. Low-lying intrauterine device is again seen within the lower uterine segment extending into the cervix. The arms of the IUD appear to extend beyond the cervical serosa (7:21, 22). T2 hypointense mass at the uterine fundus measures 2.2 x 1.5 x 1.6 cm (3:18, 9:27), likely leiomyoma. Right ovary (2:15) contains an intrinsically T1 hyperintense 9 x 7 mm ovoid focus (15:25). Nonenhancing Intrinsically T1 hyperintense left adnexal cystic structure measures 7.0 x 4.4 cm (15:25) and demonstrates internal layering fluid level. Within the medial aspect of this cystic structure, there are 2 heterogeneously T1 hyperintense rounded foci measuring 3.1 x 2.4 cm anteriorly (15:17) and 1.5 x 1.0 cm posteriorly (15:15). These lesions demonstrate marked diffusion restriction. No intrinsic fat signal. Other: Partially imaged ventriculoperitoneal shunt catheter. Interval decreased small volume ascites status post paracentesis. The peritoneal ascites extends into the right posterior pelvis in a slightly serpentine fashion. Persistent diffuse peritoneal thickening and enhancement. Mild T2 hypointensity within the dependent posterior pelvis measures 1.9 x 1.2 cm (3:20), demonstrating diffusion restriction. Partially imaged fluid collection along the right paracolic gutter measures 3.0 x 2.0 cm (3:13), previously 4.8 x 1.6 cm. Musculoskeletal: No suspicious bone lesions identified. Mild body wall edema. IMPRESSION: 1. Nonenhancing left adnexal cystic structure measures 7.0 x 4.4 cm and demonstrates internal layering fluid level and contains 2 rounded foci which  demonstrate marked diffusion  restriction. Findings are favored to represent endometriomas with hemorrhage. Malignant degeneration is not excluded. 2. Right ovary contains an intrinsically T1 hyperintense 9 x 7 mm ovoid focus, likely an additional small endometrioma. 3. Findings of peritonitis with decreased small volume ascites status post paracentesis. Mild T2 hypointensity within the dependent posterior pelvis may represent layering infectious/inflammatory debris or an endometrial implant. 4. Low-lying intrauterine device within the lower uterine segment extending into the cervix. The arms of the IUD appear to extend beyond the cervical serosa. Electronically Signed   By: Limin  Xu M.D.   On: 09/04/2024 14:38   US  Paracentesis Result Date: 09/04/2024 INDICATION: Patient admitted with abdominal pain and distention, found to have new onset ascites concerning for possible malignancy. Request for diagnostic and therapeutic paracentesis. EXAM: ULTRASOUND GUIDED DIAGNOSTIC AND THERAPEUTIC PARACENTESIS MEDICATIONS: 8 mL 1% lidocaine  COMPLICATIONS: None immediate. PROCEDURE: Informed written consent was obtained from the patient's mother Annjanette Wertenberger after a discussion of the risks, benefits and alternatives to treatment. A timeout was performed prior to the initiation of the procedure. Initial ultrasound scanning demonstrates a large amount of ascites with multiple septations within the right and left lower abdominal quadrants. The right lower abdomen was prepped and draped in the usual sterile fashion. 1% lidocaine  was used for local anesthesia. Following this, a 19 gauge, 7-cm, Yueh catheter was introduced. An ultrasound image was saved for documentation purposes. The paracentesis was performed. The catheter was removed and a dressing was applied. The patient tolerated the procedure well without immediate post procedural complication. FINDINGS: A total of approximately 2.2 L of clear yellow fluid was removed. Samples were sent to the  laboratory as requested by the clinical team. IMPRESSION: Successful ultrasound-guided paracentesis yielding 2.2 liters of peritoneal fluid. Electronically Signed   By: Cordella Banner   On: 09/04/2024 13:44   CT ABDOMEN PELVIS W CONTRAST Result Date: 09/03/2024 EXAM: CT ABDOMEN AND PELVIS WITH CONTRAST 09/03/2024 10:28:08 PM TECHNIQUE: CT of the abdomen and pelvis was performed with the administration of intravenous contrast. Multiplanar reformatted images are provided for review. Automated exposure control, iterative reconstruction, and/or weight-based adjustment of the mA/kV was utilized to reduce the radiation dose to as low as reasonably achievable. 75 mL of iohexol  (OMNIPAQUE ) 300 MG/ML solution was administered. COMPARISON: Ultrasound pelvis 09/03/2024, abdominal radiograph 09/03/2024, CT abdomen and pelvis 08/30/2024. CLINICAL HISTORY: Adnexal mass. FINDINGS: LOWER CHEST: Mild atelectasis in the lung bases. Developing pleural thickening with possible developing loculation of some of the fluid. LIVER: Low attenuation changes in the left lobe of the liver probably represent areas of focal fatty infiltration. No change since prior study. GALLBLADDER AND BILE DUCTS: Gallbladder, bile ducts, and pancreas are unremarkable. SPLEEN: Spleen is normal. PANCREAS: Gallbladder, bile ducts, and pancreas are unremarkable. ADRENAL GLANDS: Adrenal glands are normal. KIDNEYS, URETERS AND BLADDER: Kidneys are normal. No stones in the kidneys or ureters. No hydronephrosis. No perinephric or periureteral stranding. Urinary bladder is unremarkable. GI AND BOWEL: The stomach, small bowel, and colon are not abnormally distended. Contrast material flows through to the rectum without evidence of obstruction. Appendix is not identified. PERITONEUM AND RETROPERITONEUM: Tubing demonstrated in the anterior chest/abdominal wall extending into the abdomen and pelvis consistent with ventriculoperitoneal shunt tubing. Large amount of  fluid throughout the abdomen and pelvis, increasing since previous study. This may indicate peritonitis or possibly developing abscess. No free air. VASCULATURE: Normal caliber abdominal aorta. No aneurysm or dissection. LYMPH NODES: No lymphadenopathy. REPRODUCTIVE ORGANS: Nodular myometrial appearance consistent with  fibroid. Intrauterine device is demonstrated in the lower uterine segment/endocervical region. Complex multiseptated cystic lesion in the left adnexa, measuring 6.2 cm in diameter. The left adnexal mass was seen on prior CT but not visualized at ultrasound. MRI is recommended for further evaluation. Malignancy with peritoneal metastatic disease is not excluded. BONES AND SOFT TISSUES: No acute osseous abnormality. No focal soft tissue abnormality. Tubing demonstrated in the anterior chest/abdominal wall extending into the abdomen and pelvis consistent with ventriculoperitoneal shunt tubing. IMPRESSION: 1. Complex multiseptated cystic lesion in the left adnexa measuring 6.2 cm, seen on prior CT but not visualized at ultrasound. MRI is recommended for further evaluation. Malignancy with peritoneal metastatic disease is not excluded. 2. Large amount of fluid throughout the abdomen and pelvis, increasing since previous study, with developing peritoneal thickening and possible loculation, suggestive of peritonitis or developing abscess. Electronically signed by: Elsie Gravely MD 09/03/2024 10:44 PM EST RP Workstation: [id number]    ROS: As above Blood pressure 118/80, pulse (!) 109, temperature 98 F (36.7 C), temperature source Oral, resp. rate 14, height 5' (1.524 m), weight 48.5 kg, last menstrual period 08/15/2024, SpO2 94%. Estimated body mass index is 20.9 kg/m as calculated from the following:   Height as of this encounter: 5' (1.524 m).   Weight as of this encounter: 48.5 kg.  Physical Exam  General: An alert 24 year old black female complaining of abdominal pain.  HEENT: The  patient's scalp incisions are well-healed.  Her shunt is in a place in the left frontal region.  Thorax: Symmetric  Abdomen: Tender and protuberant.  Neurologic exam: The patient is alert and oriented.  Cranial nerves II through XII are grossly intact bilaterally.  The patient's motor strength is normal in all 4 extremities.  Sensory function is intact to light touch sensation all tested dermatomes bilaterally.  Cerebellar function is intact to rapid alternating movements bilaterally.  I have reviewed the patient's CT of the abdomen and pelvis performed on 326.  She has a large amount of peritoneal fluid.  Assessment/Plan: Ascites/peritonitis: I have discussed the situation with Dr. Will.  The patient has a very complex shunt history at [facility].  Her last revisions were in 2015.  She has no symptoms of a proximal shunt malfunction/occlusion nor meningitis.  The large amount of peritoneal fluid does not seem consistent with a simple pseudocyst from the shunt.  I think it is unlikely that her shunt is the primary source of her ascites/peritonitis.  I think she will likely need an externalization of her peritoneal shunt catheter, further investigation and treatment of the source of her abdominal/pelvic abnormalities, and eventually revision of her shunt.  Given all the difficulties she had with revision of her shunt in 2015, I have recommended transfer to [facility] for further evaluation and management of this complex problem.  I discussed recommendations with the patient, her mother, and grandmother and answered all their questions.  ALGA SOUTHALL 09/05/2024, 10:38 AM        [1]  Allergies Allergen Reactions   Shellfish Allergy Swelling    Throat swells   Fish Allergy Swelling    Angioedema after eating tilapia - positive skin prick testing   Other Swelling     Positive skin prick testing to tree nuts.

## 2024-09-05 NOTE — Consult Note (Signed)
 "                                                                  [facility]                                                                                        Patient Identification: Patient Name: Crystal Greene MRN: [id number] Admit Date: 09/02/2024 11:27 PM Today's Date: 09/05/2024 Reason for consult: ? concerns for intra-abdominal infection, peritonitis, VP shunt infection vs non infective intraabdominal process Requesting provider: Dr Alvia   Principal Problem:   Adnexal cyst Active Problems:   Ascites   Antibiotics:  Ceftriaxone  1/2-1/3 Zosyn  1/4-  Lines/Hardware:  Assessment # MRI with nonenhancing left adnexal cystic structure 7.0 x 4.4 cm with internal layering fluid level and 2 rounded foci which demonstrates mild diffusion restriction,  Favored to represent endometriomas with hemorrhage.  Malignant degeneration is not excluded.  Concern for small endometrioma in rt ovary.  -Evaluated by Gynecology, malignancy, PID thought to be less likely.  CA125 39.4.  CA 19-9 1 AFP less than 1.8, CEA 0.8, LDH 85.  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  Recommended paracentesis.  - Neurosurgery following, no neurosurgical intervention planned so far.  # Probable  peritonitis/infected ascites vs abscess - 1/4 s/p ultrasound-guided paracentesis and 2.2 L of peritoneal fluid. Cx with NG in < 24 hrs and no Gram stain.  Fluid with WBC 738, neutrophilic. Cytology pending    # E coli UTI  - has GU symptoms or dysuria and increased frequency - on ceftriaxone > zosyn    Recommendations  -Continue Zosyn , follow-up ascitic fluid cultures. Unclear if truly infected ascites/abscess with only 738 WBC in ascitic fluid, being afebrile. However, will need VP shunt to removed/externalized if truly infected.  -FU cytology, urine GC  -Monitor CBC and CMP -Pain  management per primary team -I reviewed notes from gynecology and neurosurgery today, plan for transfer to [facility] for multidiscipline care noted which I agree.  -Universal/standard isolation precautions - ID available as needed D/W primary team  Rest of the management as per the primary team. Please call with questions or concerns.  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  She was trialed with magnesium citrate due to issues with bowel movements with some relief. No reported nausea or vomiting or fevers except some dysuria/increased urinary frequency.   CT done by PCP 12/17 with a small amount of ascites mainly in the pelvis, indeterminate areas in the pelvis.  Further evaluation with contrast-enhanced CT scan abdomen and  pelvis and pelvic ultrasound recommended.   Followed by repeat CT 12/30  Distended small bowel with wall thickening and fold thickening, likely due to enteritis but could indicate early or partial small bowel obstruction. Contrast material is not to the colon yet.  Moderate diffuse abdominal and pelvic ascites, progressing since the prior study. 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 Pelvic ultrasound is recommended for further evaluation.  At ED afebrile Labs remarkable for albumin 2.8, AST 11, WBC 10.7, hemoglobin 9.0, platelets 500 HIV NR UA negative leukocytes, positive nitrite, 100 protein, many bacteria 0-5 RBC, 6-10 WBC UPT negative.  Urine culture 60,000 colonies/ml E. coli  She  was given IV ceftriaxone , IV morphine , IVF and admitted.   ROS: General- Denies fever, chills, poor appetite present  HEENT - Denies headache, blurry vision, neck pain, sinus pain Chest - Denies any chest pain, SOB or cough CVS- Denies any dizziness/lightheadedness, syncopal attacks, palpitations Abdomen- Denies any nausea, vomiting, abdominal pain and distention present, constipation present Neuro - Denies any weakness, numbness, tingling sensation Psych - Denies any changes in mood irritability or depressive symptoms GU- Denies any hematuria.  Endorses dysuria and increased frequency of urination Skin - denies any rashes/lesions MSK - denies any joint pain/swelling or restricted ROM   Past Medical History:  Diagnosis Date   Chronic lung disease    H/O prematurity 05/19/2013   24 wks PPROM and vaginal delivery; originally twin gestation with pregnancy complicated by a single fetal demise.     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    Stroke [facility])    Past Surgical History:  Procedure Laterality Date   EYE SURGERY     LAPAROSCOPIC REVISION VENTRICULAR-PERITONEAL (V-P) SHUNT     OVARY SURGERY     UMBILICAL HERNIA REPAIR     VENTRICULO-PERITONEAL SHUNT PLACEMENT / LAPAROSCOPIC INSERTION PERITONEAL CATHETER     Scheduled Meds:  enoxaparin  (LOVENOX ) injection  30 mg Subcutaneous Daily   Continuous Infusions:  sodium chloride  100 mL/hr at 09/05/24 9361   sodium chloride  Stopped (09/05/24 0547)   piperacillin -tazobactam (ZOSYN )  IV 12.5 mL/hr at 09/05/24 0550   PRN Meds:.sodium chloride , HYDROmorphone  (DILAUDID ) injection, lip balm, morphine  injection, ondansetron  (ZOFRAN ) IV  Allergies[1]  Social History   Socioeconomic History   Marital status: Single    Spouse name: Not on file   Number of children: Not on file   Years of education: Not on file   Highest education level: Not on  file  Occupational History   Not on file  Tobacco Use   Smoking status: Never   Smokeless tobacco: Never  Vaping Use   Vaping status: Some Days  Substance and Sexual Activity   Alcohol use: Not Currently   Drug use: Not Currently    Types: Marijuana   Sexual activity: Not Currently  Other Topics Concern   Not on file  Social History Narrative   Not on file   Social Drivers of Health   Tobacco Use: Low Risk (09/03/2024)   Patient History    Smoking Tobacco Use: Never    Smokeless Tobacco Use: Never    Passive Exposure: Not on file  Financial Resource Strain: Not on file  Food Insecurity: No Food Insecurity (09/03/2024)   Epic    Worried About Radiation Protection Practitioner of Food in the Last Year: Never true    Ran Out  of Food in the Last Year: Never true  Transportation Needs: No Transportation Needs (09/03/2024)   Epic    Lack of Transportation (Medical): No    Lack of Transportation (Non-Medical): No  Physical Activity: Not on file  Stress: Not on file  Social Connections: Not on file  Intimate Partner Violence: Not At Risk (09/03/2024)   Epic    Fear of Current or Ex-Partner: No    Emotionally Abused: No    Physically Abused: No    Sexually Abused: No  Depression (PHQ2-9): Low Risk (01/29/2023)   Depression (PHQ2-9)    PHQ-2 Score: 2  Alcohol Screen: Not on file  Housing: High Risk (09/03/2024)   Epic    Unable to Pay for Housing in the Last Year: No    Number of Times Moved in the Last Year: 0    Homeless in the Last Year: Yes  Utilities: Not At Risk (09/03/2024)   Epic    Threatened with loss of utilities: No  Health Literacy: Not on file   Family History  Problem Relation Age of Onset   Other Mother        Alpha Thalassemia   Asthma Mother    Pneumonia Maternal Grandfather        died at age 61   High blood pressure Paternal Grandmother    Heart Problems Paternal Grandmother    Bleeding Disorder Paternal Grandmother    Heart attack Paternal Grandmother    Vitals BP 118/80  (BP Location: Left Arm)   Pulse (!) 109   Temp 98 F (36.7 C) (Oral)   Resp 14   Ht 5' (1.524 m)   Wt 48.5 kg   LMP 08/15/2024 (Exact Date)   SpO2 94%   BMI 20.90 kg/m   Physical Exam Constitutional: Adult female sitting in the bed, in moderate pain, nontoxic-appearing    Comments: HEENT WNL  Cardiovascular:     Rate and Rhythm: Normal rate    Heart sounds: s1s2  Pulmonary:     Effort: Pulmonary effort is normal.     Comments: Normal breath sounds  Abdominal:     Palpations: Abdomen is distended, generalized tenderness/guarding +    Tenderness: BS positive  Musculoskeletal:        General: No swelling or tenderness in peripheral joints  Skin:    Comments: No rashes  Neurological:     General: Awake, alert and oriented, grossly nonfocal, ambulatory  Psychiatric:        Mood and Affect: Mood normal.    Pertinent Microbiology Results for orders placed or performed during the hospital encounter of 09/02/24  Urine Culture     Status: Abnormal   Collection Time: 09/03/24 12:08 AM   Specimen: Urine, Clean Catch  Result Value Ref Range Status   Specimen Description   Final    URINE, CLEAN CATCH Performed at [facility], [address] [place], [place] [address]    Special Requests   Final    NONE Performed at [facility], [address] [place], [place] [address]    Culture 60,000 COLONIES/mL ESCHERICHIA COLI (A)  Final   Report Status 09/05/2024 FINAL  Final   Organism ID, Bacteria ESCHERICHIA COLI (A)  Final      Susceptibility   Escherichia coli - MIC*    AMPICILLIN 16 INTERMEDIATE Intermediate     CEFAZOLIN (URINE) Value in next row Sensitive      4 SENSITIVEThis is a modified FDA-approved test that has been  validated and its performance characteristics determined by the reporting laboratory.  This laboratory is certified under the Clinical Laboratory Improvement Amendments CLIA as qualified to perform high  complexity clinical laboratory testing.    CEFEPIME Value in next row Sensitive      4 SENSITIVEThis is a modified FDA-approved test that has been validated and its performance characteristics determined by the reporting laboratory.  This laboratory is certified under the Clinical Laboratory Improvement Amendments CLIA as qualified to perform high complexity clinical laboratory testing.    ERTAPENEM Value in next row Sensitive      4 SENSITIVEThis is a modified FDA-approved test that has been validated and its performance characteristics determined by the reporting laboratory.  This laboratory is certified under the Clinical Laboratory Improvement Amendments CLIA as qualified to perform high complexity clinical laboratory testing.    CEFTRIAXONE  Value in next row Sensitive      4 SENSITIVEThis is a modified FDA-approved test that has been validated and its performance characteristics determined by the reporting laboratory.  This laboratory is certified under the Clinical Laboratory Improvement Amendments CLIA as qualified to perform high complexity clinical laboratory testing.    CIPROFLOXACIN Value in next row Resistant      4 SENSITIVEThis is a modified FDA-approved test that has been validated and its performance characteristics determined by the reporting laboratory.  This laboratory is certified under the Clinical Laboratory Improvement Amendments CLIA as qualified to perform high complexity clinical laboratory testing.    GENTAMICIN Value in next row Sensitive      4 SENSITIVEThis is a modified FDA-approved test that has been validated and its performance characteristics determined by the reporting laboratory.  This laboratory is certified under the Clinical Laboratory Improvement Amendments CLIA as qualified to perform high complexity clinical laboratory testing.    NITROFURANTOIN Value in next row Sensitive      4 SENSITIVEThis is a modified FDA-approved test that has been validated and its  performance characteristics determined by the reporting laboratory.  This laboratory is certified under the Clinical Laboratory Improvement Amendments CLIA as qualified to perform high complexity clinical laboratory testing.    TRIMETH/SULFA Value in next row Resistant      4 SENSITIVEThis is a modified FDA-approved test that has been validated and its performance characteristics determined by the reporting laboratory.  This laboratory is certified under the Clinical Laboratory Improvement Amendments CLIA as qualified to perform high complexity clinical laboratory testing.    AMPICILLIN/SULBACTAM Value in next row Sensitive      4 SENSITIVEThis is a modified FDA-approved test that has been validated and its performance characteristics determined by the reporting laboratory.  This laboratory is certified under the Clinical Laboratory Improvement Amendments CLIA as qualified to perform high complexity clinical laboratory testing.    PIP/TAZO Value in next row Sensitive      <=4 SENSITIVEThis is a modified FDA-approved test that has been validated and its performance characteristics determined by the reporting laboratory.  This laboratory is certified under the Clinical Laboratory Improvement Amendments CLIA as qualified to perform high complexity clinical laboratory testing.    MEROPENEM Value in next row Sensitive      <=4 SENSITIVEThis is a modified FDA-approved test that has been validated and its performance characteristics determined by the reporting laboratory.  This laboratory is certified under the Clinical Laboratory Improvement Amendments CLIA as qualified to perform high complexity clinical laboratory testing.    * 60,000 COLONIES/mL ESCHERICHIA COLI  Body fluid culture w Gram Stain  Status: None (Preliminary result)   Collection Time: 09/04/24 12:48 PM   Specimen: PATH Cytology Peritoneal fluid  Result Value Ref Range Status   Specimen Description   Final    PERITONEAL Performed at  [facility], [address], [place], [place] [address]    Special Requests   Final    NONE Performed at [facility], [address] [place], [place] [address]    Gram Stain NO WBC SEEN NO ORGANISMS SEEN   Final   Culture   Final    NO GROWTH < 24 HOURS Performed at [facility], [address], [place], [place] [address]    Report Status PENDING  Incomplete   Pertinent Lab seen by me:    Latest Ref Rng & Units 09/04/2024    5:13 AM 09/03/2024    1:41 PM 09/03/2024    5:18 AM  CBC  WBC 4.0 - 10.5 K/uL 13.0  11.2  10.7   Hemoglobin 12.0 - 15.0 g/dL 89.7  89.7  9.0   Hematocrit 36.0 - 46.0 % 31.9  31.4  26.2   Platelets 150 - 400 K/uL 594  610  500       Latest Ref Rng & Units 09/04/2024    5:13 AM 09/03/2024    1:41 PM 09/03/2024    5:18 AM  CMP  Glucose 70 - 99 mg/dL 82   87   BUN 6 - 20 mg/dL 8   11   Creatinine 9.55 - 1.00 mg/dL 9.37  9.30  9.42   Sodium 135 - 145 mmol/L 137   135   Potassium 3.5 - 5.1 mmol/L 3.9   3.6   Chloride 98 - 111 mmol/L 101   101   CO2 22 - 32 mmol/L 20   23   Calcium  8.9 - 10.3 mg/dL 9.5   9.0   Total Protein 6.5 - 8.1 g/dL 7.6   6.9   Total Bilirubin 0.0 - 1.2 mg/dL 0.4   0.3   Alkaline Phos 38 - 126 U/L 108   82   AST 15 - 41 U/L 23   11   ALT 0 - 44 U/L 14   9      Pertinent Imagings/Other Imagings Plain films and CT images have been personally visualized and interpreted; radiology reports have been reviewed. Decision making incorporated into the Impression / Recommendations.  MR PELVIS W WO CONTRAST Result Date: 09/04/2024 CLINICAL DATA:  Multiseptated left adnexal cystic lesion. Large volume ascites status post paracentesis EXAM: MRI PELVIS WITHOUT AND WITH CONTRAST TECHNIQUE: Multiplanar multisequence MR imaging of the pelvis was performed both before and after administration of intravenous contrast. CONTRAST:  5mL GADAVIST  GADOBUTROL  1 MMOL/ML IV SOLN COMPARISON:  CT abdomen and pelvis dated  09/03/2024 FINDINGS: Urinary Tract:  No abnormality visualized. Bowel:  Unremarkable visualized pelvic bowel loops. Vascular/Lymphatic: No pathologically enlarged lymph nodes. No significant vascular abnormality seen. Reproductive: Uterus measures 8.4 cm in sagittal dimension. The endometrium measures 10 mm. Low-lying intrauterine device is again seen within the lower uterine segment extending into the cervix. The arms of the IUD appear to extend beyond the cervical serosa (7:21, 22). T2 hypointense mass at the uterine fundus measures 2.2 x 1.5 x 1.6 cm (3:18, 9:27), likely leiomyoma. Right ovary (2:15) contains an intrinsically T1 hyperintense 9 x 7 mm ovoid focus (15:25). Nonenhancing Intrinsically T1 hyperintense left adnexal cystic structure measures 7.0 x 4.4 cm (15:25) and demonstrates internal layering fluid level.  Within the medial aspect of this cystic structure, there are 2 heterogeneously T1 hyperintense rounded foci measuring 3.1 x 2.4 cm anteriorly (15:17) and 1.5 x 1.0 cm posteriorly (15:15). These lesions demonstrate marked diffusion restriction. No intrinsic fat signal. Other: Partially imaged ventriculoperitoneal shunt catheter. Interval decreased small volume ascites status post paracentesis. The peritoneal ascites extends into the right posterior pelvis in a slightly serpentine fashion. Persistent diffuse peritoneal thickening and enhancement. Mild T2 hypointensity within the dependent posterior pelvis measures 1.9 x 1.2 cm (3:20), demonstrating diffusion restriction. Partially imaged fluid collection along the right paracolic gutter measures 3.0 x 2.0 cm (3:13), previously 4.8 x 1.6 cm. Musculoskeletal: No suspicious bone lesions identified. Mild body wall edema. IMPRESSION: 1. Nonenhancing left adnexal cystic structure measures 7.0 x 4.4 cm and demonstrates internal layering fluid level and contains 2 rounded foci which demonstrate marked diffusion restriction. Findings are favored to represent  endometriomas with hemorrhage. Malignant degeneration is not excluded. 2. Right ovary contains an intrinsically T1 hyperintense 9 x 7 mm ovoid focus, likely an additional small endometrioma. 3. Findings of peritonitis with decreased small volume ascites status post paracentesis. Mild T2 hypointensity within the dependent posterior pelvis may represent layering infectious/inflammatory debris or an endometrial implant. 4. Low-lying intrauterine device within the lower uterine segment extending into the cervix. The arms of the IUD appear to extend beyond the cervical serosa. Electronically Signed   By: Limin  Xu M.D.   On: 09/04/2024 14:38   US  Paracentesis Result Date: 09/04/2024 INDICATION: Patient admitted with abdominal pain and distention, found to have new onset ascites concerning for possible malignancy. Request for diagnostic and therapeutic paracentesis. EXAM: ULTRASOUND GUIDED DIAGNOSTIC AND THERAPEUTIC PARACENTESIS MEDICATIONS: 8 mL 1% lidocaine  COMPLICATIONS: None immediate. PROCEDURE: Informed written consent was obtained from the patient's mother Amma Crear after a discussion of the risks, benefits and alternatives to treatment. A timeout was performed prior to the initiation of the procedure. Initial ultrasound scanning demonstrates a large amount of ascites with multiple septations within the right and left lower abdominal quadrants. The right lower abdomen was prepped and draped in the usual sterile fashion. 1% lidocaine  was used for local anesthesia. Following this, a 19 gauge, 7-cm, Yueh catheter was introduced. An ultrasound image was saved for documentation purposes. The paracentesis was performed. The catheter was removed and a dressing was applied. The patient tolerated the procedure well without immediate post procedural complication. FINDINGS: A total of approximately 2.2 L of clear yellow fluid was removed. Samples were sent to the laboratory as requested by the clinical team.  IMPRESSION: Successful ultrasound-guided paracentesis yielding 2.2 liters of peritoneal fluid. Electronically Signed   By: Cordella Banner   On: 09/04/2024 13:44   CT ABDOMEN PELVIS W CONTRAST Result Date: 09/03/2024 EXAM: CT ABDOMEN AND PELVIS WITH CONTRAST 09/03/2024 10:28:08 PM TECHNIQUE: CT of the abdomen and pelvis was performed with the administration of intravenous contrast. Multiplanar reformatted images are provided for review. Automated exposure control, iterative reconstruction, and/or weight-based adjustment of the mA/kV was utilized to reduce the radiation dose to as low as reasonably achievable. 75 mL of iohexol  (OMNIPAQUE ) 300 MG/ML solution was administered. COMPARISON: Ultrasound pelvis 09/03/2024, abdominal radiograph 09/03/2024, CT abdomen and pelvis 08/30/2024. CLINICAL HISTORY: Adnexal mass. FINDINGS: LOWER CHEST: Mild atelectasis in the lung bases. Developing pleural thickening with possible developing loculation of some of the fluid. LIVER: Low attenuation changes in the left lobe of the liver probably represent areas of focal fatty infiltration. No change since prior study. GALLBLADDER AND BILE  DUCTS: Gallbladder, bile ducts, and pancreas are unremarkable. SPLEEN: Spleen is normal. PANCREAS: Gallbladder, bile ducts, and pancreas are unremarkable. ADRENAL GLANDS: Adrenal glands are normal. KIDNEYS, URETERS AND BLADDER: Kidneys are normal. No stones in the kidneys or ureters. No hydronephrosis. No perinephric or periureteral stranding. Urinary bladder is unremarkable. GI AND BOWEL: The stomach, small bowel, and colon are not abnormally distended. Contrast material flows through to the rectum without evidence of obstruction. Appendix is not identified. PERITONEUM AND RETROPERITONEUM: Tubing demonstrated in the anterior chest/abdominal wall extending into the abdomen and pelvis consistent with ventriculoperitoneal shunt tubing. Large amount of fluid throughout the abdomen and pelvis,  increasing since previous study. This may indicate peritonitis or possibly developing abscess. No free air. VASCULATURE: Normal caliber abdominal aorta. No aneurysm or dissection. LYMPH NODES: No lymphadenopathy. REPRODUCTIVE ORGANS: Nodular myometrial appearance consistent with fibroid. Intrauterine device is demonstrated in the lower uterine segment/endocervical region. Complex multiseptated cystic lesion in the left adnexa, measuring 6.2 cm in diameter. The left adnexal mass was seen on prior CT but not visualized at ultrasound. MRI is recommended for further evaluation. Malignancy with peritoneal metastatic disease is not excluded. BONES AND SOFT TISSUES: No acute osseous abnormality. No focal soft tissue abnormality. Tubing demonstrated in the anterior chest/abdominal wall extending into the abdomen and pelvis consistent with ventriculoperitoneal shunt tubing. IMPRESSION: 1. Complex multiseptated cystic lesion in the left adnexa measuring 6.2 cm, seen on prior CT but not visualized at ultrasound. MRI is recommended for further evaluation. Malignancy with peritoneal metastatic disease is not excluded. 2. Large amount of fluid throughout the abdomen and pelvis, increasing since previous study, with developing peritoneal thickening and possible loculation, suggestive of peritonitis or developing abscess. Electronically signed by: Elsie Gravely MD 09/03/2024 10:44 PM EST RP Workstation: [id number]   US  PELVIC COMPLETE WITH TRANSVAGINAL Result Date: 09/03/2024 EXAM: US  Pelvis, Complete Transvaginal and Transabdominal without Doppler TECHNIQUE: Transabdominal and transvaginal pelvic duplex ultrasound using B-mode/gray scaled imaging without Doppler spectral analysis and color flow was obtained. COMPARISON: CT abdomen and pelvis from 08/30/2024. CLINICAL HISTORY: [id number] Ovarian mass, left [id number] Ovarian mass, left. FINDINGS: UTERUS: Anteverted uterus measures 7.6 x 3.3 x 4.2 cm with a volume of 55.3 cc.  Intramural fundal fibroid measures 1.3 x 1.2 x 2.0 cm. ENDOMETRIAL STRIPE: The endometrium measures 11 mm in thickness. IUD may be malpositioned within the lower uterine segment and cervical region. This appears similar to the CT findings from 08/30/2024. RIGHT OVARY: The right ovary is not visualized and is obscured by complex fluid and overlying bowel gas. LEFT OVARY: The left ovary is also not visualized and is obscured by complex fluid and overlying bowel gas. FREE FLUID: A large volume of fluid is identified within the pelvis, there are multiple areas of low-level internal echoes and multiple septations. On the CT from 08/30/2024 corresponding multifocal loculated fluid collections noted with a Ventriculoperitoneal shunt is in place. IMPRESSION: 1. Large volume of complex, septated pelvic fluid with low-level internal echoes, similar to prior CT, with the ovaries not visualized due to overlying fluid and bowel gas; The most likely differential diagnosis based on these findings includes peritonitis with multiple pelvic abscesses (possibly due to infected ventriculoperitoneal shunt tubing,) malignancy (e.g., ovarian cancer with ascites and peritoneal carcinomatosis) and pelvic inflammatory disease (PID) with tubo-ovarian abscesses or pyosalpinx. Appropriate follow-up guidelines include correlation with clinical symptoms and laboratory findings (e.g., CA-125), further imaging such as a dedicated pelvic MRI for better characterization of the adnexal regions and the complex fluid collections, and  consideration of fluid aspiration. 2. The IUD is malpositioned within the lower uterine segment and cervix. Electronically signed by: Waddell Calk MD 09/03/2024 09:18 AM EST RP Workstation: [id number]   DG Abdomen 1 View Result Date: 09/03/2024 EXAM: 1 VIEW XRAY OF THE ABDOMEN 09/03/2024 02:10:00 AM COMPARISON: 06/22/2022 CLINICAL HISTORY: distention, concern for early obstruction FINDINGS: BOWEL: Nonobstructive bowel  gas pattern. Oral contrast material noted throughout the colon. SOFT TISSUES: IUD noted in the lower pelvis. VP shunt catheter terminates in the lower abdomen. No abnormal calcifications. BONES: No acute fracture. IMPRESSION: 1. No acute findings. Electronically signed by: Franky Crease MD 09/03/2024 02:21 AM EST RP Workstation: [id number]   CT ABDOMEN PELVIS W CONTRAST Result Date: 09/03/2024 EXAM: CT ABDOMEN AND PELVIS WITH CONTRAST 08/30/2024 03:06:08 PM TECHNIQUE: CT of the abdomen and pelvis was performed with the administration of 80 mL of iopamidol  (ISOVUE -300) 61% injection. Multiplanar reformatted images are provided for review. Automated exposure control, iterative reconstruction, and/or weight-based adjustment of the mA/kV was utilized to reduce the radiation dose to as low as reasonably achievable. COMPARISON: Comparison with 08/17/2024. CLINICAL HISTORY: Abdominal pain, acute, nonlocalized; pelvic ascites. FINDINGS: LOWER CHEST: Lung bases are clear. LIVER: Low attenuation in the left lobe of the liver likely representing focal fatty infiltration. GALLBLADDER AND BILE DUCTS: Gallbladder and bile ducts are normal. SPLEEN: The spleen is normal. PANCREAS: The pancreas is normal. ADRENAL GLANDS: Adrenal glands are normal. KIDNEYS, URETERS AND BLADDER: Kidneys, ureters, and the bladder are normal. No stones in the kidneys or ureters. No hydronephrosis. No perinephric or periureteral stranding. GI AND BOWEL: Stomach demonstrates no acute abnormality. Distended small bowel with wall thickening and fold thickening diffusely. Contrast material flows through to the ileum but none distally. Changes are likely due to enteritis but could indicate early or partial small bowel obstruction. Stool-filled colon without abnormal distention. PERITONEUM AND RETROPERITONEUM: Moderate diffuse abdominal and pelvic ascites is progressing since the prior study. No free air. VASCULATURE: Aorta is normal in caliber. LYMPH NODES:  Lumbar adenopathy is nonspecific, possibly reactive. REPRODUCTIVE ORGANS: The uterus is not enlarged. There is an intrauterine device in the lower uterine segment/endocervical region. This is low position. There is a complex septated left adnexal cystic mass measuring 4.2 x 5.5 cm diameter. Pelvic ultrasound is recommended for further evaluation. 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 No acute osseous abnormality. No focal soft tissue abnormality. IMPRESSION: 1. Distended small bowel with wall thickening and fold thickening, likely due to enteritis but could indicate early or partial small bowel obstruction. Contrast material is not to the colon yet. . 2. Moderate diffuse abdominal and pelvic ascites, progressing since the prior study. 3. 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 Pelvic ultrasound is recommended for further evaluation. 4. Low-position intrauterine device in the lower uterine segment/endocervical region. Electronically signed by: Elsie Gravely MD 09/03/2024 01:18 AM EST RP Workstation: [id number]   CT ABDOMEN PELVIS WO CONTRAST Result Date: 08/17/2024 CLINICAL DATA:  Of. EXAM: CT ABDOMEN AND PELVIS WITHOUT CONTRAST TECHNIQUE: Multidetector CT imaging of the abdomen and pelvis was performed following the standard protocol without IV contrast. RADIATION DOSE REDUCTION: This exam was performed according to the departmental dose-optimization program which includes automated exposure control, adjustment of the mA and/or kV according to patient size and/or use of iterative reconstruction technique. COMPARISON:  None Available. FINDINGS: Lower chest: The lung bases are clear. No pleural effusion. The heart is normal in size. No pericardial effusion. Hepatobiliary: The liver is  normal in size. Non-cirrhotic configuration. No suspicious mass. No intrahepatic or extrahepatic bile duct dilation. No calcified gallstones. Normal gallbladder wall  thickness. No pericholecystic inflammatory changes. Pancreas: Unremarkable. No pancreatic ductal dilatation or surrounding inflammatory changes. Spleen: Within normal limits. No focal lesion. Adrenals/Urinary Tract: Adrenal glands are unremarkable. No suspicious renal mass within the limitations of this unenhanced exam. No nephroureterolithiasis or obstructive uropathy. Urinary bladder is under distended, precluding optimal assessment. However, no large mass or stones identified. No perivesical fat stranding. Stomach/Bowel: No disproportionate dilation of the small or large bowel loops. No evidence of abnormal bowel wall thickening or inflammatory changes. The appendix is unremarkable. Vascular/Lymphatic: Presumed ventriculoperitoneal shunt tubings noted with its tip in the right lower quadrant, anteriorly. There is small amount of ascites mainly in the pelvis. However, there are slightly hyperattenuating areas in the dependent pelvis on the right to the rectum (series 2, image 63), which are indeterminate in etiology. There is elongated bilobed approximately 3.3 x 8.9 cm structure in the right side of the pelvis which is of unknown origin. The differential diagnosis includes cystic lesion of the right ovary/fallopian tube versus unopacified small bowel loop. There is also an ill-defined hyperattenuating 2.8 x 3.3 cm area in the left lower quadrant, anteriorly, which is favored to represent left ovarian tissue. However, overall, evaluation is markedly limited due to lack of intravenous contrast. No pneumoperitoneum. Left lower quadrant anteriorly (series 2, image 57). No abdominal or pelvic lymphadenopathy, by size criteria. No aneurysmal dilation of the major abdominal arteries. Reproductive: Limited evaluation of reported organs on the CT scan. However, normal-size anteverted uterus noted with T-shaped intrauterine device, which is low in position mainly lying in the cervix. The endometrium is thickened measuring  up to 9-10 mm, which is within normal limits for patient of this age group. Please see above under vascular/lymphatic sub section for comments regarding bilateral adnexa. Other: The visualized soft tissues and abdominal wall are unremarkable. Musculoskeletal: No suspicious osseous lesions. IMPRESSION: 1. There is a low-lying intrauterine device, which is mainly lying in the cervix. 2. There is a small amount of ascites mainly in the pelvis. 3. There are indeterminate areas in the pelvis, as described above. Further evaluation with contrast-enhanced CT scan abdomen and pelvis and pelvic ultrasound is recommended. 4. Multiple other nonacute observations, as described above. These results will be called to the ordering clinician or representative by the Radiologist Assistant, and communication documented in the PACS or [employer]. Electronically Signed   By: Ree Molt M.D.   On: 08/17/2024 17:55    I personally spent a total of 87 minutes in the care of the patient today including preparing to see the patient, getting/reviewing separately obtained history, performing a medically appropriate exam/evaluation, counseling and educating, placing orders, referring and communicating with other health care professionals, documenting clinical information in the EHR, independently interpreting results, communicating results, and coordinating care.   Annalee Orem, MD Infectious Disease Physician [facility] Pager: [phone]      [1]  Allergies Allergen Reactions   Shellfish Allergy Swelling    Throat swells   Fish Allergy Swelling    Angioedema after eating tilapia - positive skin prick testing   Other Swelling     Positive skin prick testing to tree nuts.    "

## 2024-09-05 NOTE — Progress Notes (Addendum)
 "  OBSTETRICS AND GYNECOLOGY ATTENDING CONSULT NOTE  Consult Date: 09/05/2024  Reason for Consult: Ovarian cyst Consulting Provider: Dr. Marilynn    Assessment/Plan: 1) Left adnexal mass/Endometrioma -Final MRI- confirms diagnosis of endometrioma which is a benign condition -Cytology of fluid still pending though based on prelim- not infectious etiology which rules out abscess/TOA -endometrioma very unlikely to be the underlying etiology of the ascites -tumor markers within normal range -conservative management of endometrioma would include management with NSAIDs and hormonal intervention- in this case replacing Mirena IUD.  -Can be followed/managed as outpatient with surgical intervention as potential outpatient option -Family had questions regarding potential surgical intervention now due to persistent pain and discussed recommendation for MIGS due to suspected endometriosis.   -Also discussed my concern with family that pain may likely be multi-factorial as ascites and associated distension would not be due to the endometrioma  -at this time, pt requesting transfer to [facility] [place] which seems appropriate to allow for multi-disciplinary approach  -appreciate care of Crystal Greene by her primary team -Will sign off at this time as no further immediate gynecologic intervention is indicated and pt to likely be transferred  2) UTI - currently on Zosyn  -mild leukocytosis    Total consultation time including face-to-face time with patient (>50% of time), reviewing chart and documentation: ~  Angelissa Supan, DO Attending Obstetrician & Gynecologist, [facility] [employer], [facility]   History of Present Illness: Crystal Greene is an 24 y.o. G0P0000 female who was admitted for abdominal pain- diagnosed with ascites s/p paracentesis and left adnexal mass- now diagnosed as endometrioma.  Pt still noting intermittent pelvic pain, but family reporting  pt needing pain medications less frequently.  Some nausea, no vomiting.  Tolerating regular diet.  No fever or chills.  Some discomfort with voiding.  No other acute gyn concerns.  Pertinent OB/GYN History: Patient's last menstrual period was 08/15/2024 (exact date). 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    Stroke [facility])     Past Surgical History:  Procedure Laterality Date   EYE SURGERY     LAPAROSCOPIC REVISION VENTRICULAR-PERITONEAL (V-P) SHUNT     OVARY SURGERY     UMBILICAL HERNIA REPAIR     VENTRICULO-PERITONEAL SHUNT PLACEMENT / LAPAROSCOPIC INSERTION  PERITONEAL CATHETER      Family History  Problem Relation Age of Onset   Other Mother        Alpha Thalassemia   Asthma Mother    Pneumonia Maternal Grandfather        died at age 65   High blood pressure  Paternal Grandmother    Heart Problems Paternal Grandmother    Bleeding Disorder Paternal Grandmother    Heart attack Paternal Grandmother     Social History:  reports that she has never smoked. She has never used smokeless tobacco. She reports that she does not currently use alcohol. She reports that she does not currently use drugs after having used the following drugs: Marijuana.  Allergies: Allergies[1]   Review of Systems: Pertinent items are noted in HPI.  Focused Physical Examination: BP 116/78 (BP Location: Left Arm)   Pulse (!) 108   Temp 98 F (36.7 C) (Oral)   Resp 18   Ht 5' (1.524 m)   Wt 48.5 kg   LMP 08/15/2024 (Exact Date)   SpO2 95%   BMI 20.90 kg/m  CONSTITUTIONAL: Well-developed, well-nourished female in no acute distress.  SKIN: Skin is warm and dry. No rash noted. Not diaphoretic. No erythema. No pallor. NEUROLGIC: Alert and oriented to person, place, and time.  PSYCHIATRIC: Normal mood and affect.  CARDIOVASCULAR: Normal heart rate noted RESPIRATORY: Normal respiratory effort ABDOMEN: Involuntarily guarding, appears distended and taught,  + diffuse tenderness noted. MUSCULOSKELETAL: No calf tenderness bilaterally EXT: no edema  Labs and Imaging: Results for orders placed or performed during the hospital encounter of 09/02/24 (from the past 72 hours)  Lipase, blood     Status: None   Collection Time: 09/02/24  6:17 PM  Result Value Ref Range   Lipase 28 11 - 51 U/L    Comment: Performed at [facility], [address], [place], [place] [address]  Comprehensive metabolic panel     Status: Abnormal   Collection Time: 09/02/24  6:17 PM  Result Value Ref Range   Sodium 136 135 - 145 mmol/L   Potassium 3.6 3.5 - 5.1 mmol/L   Chloride 97 (L) 98 - 111 mmol/L   CO2 24 22 - 32 mmol/L   Glucose, Bld 83 70 - 99 mg/dL    Comment: Glucose reference range applies only to samples taken after fasting for at least 8 hours.   BUN 16 6 - 20  mg/dL   Creatinine, Ser 9.32 0.44 - 1.00 mg/dL   Calcium  9.8 8.9 - 10.3 mg/dL   Total Protein 8.0 6.5 - 8.1 g/dL   Albumin 3.5 3.5 - 5.0 g/dL   AST 11 (L) 15 - 41 U/L   ALT 9 0 - 44 U/L   Alkaline Phosphatase 108 38 - 126 U/L   Total Bilirubin 0.4 0.0 - 1.2 mg/dL   GFR, Estimated >39 >39 mL/min    Comment: (NOTE) Calculated using the CKD-EPI Creatinine Equation (2021)    Anion gap 15 5 - 15    Comment: Performed at [facility], [address] [place], [place] [address]  CBC     Status: Abnormal   Collection Time: 09/02/24  6:17 PM  Result Value Ref Range   WBC 11.9 (H) 4.0 - 10.5 K/uL   RBC 3.96 3.87 - 5.11 MIL/uL   Hemoglobin 10.3 (L) 12.0 - 15.0 g/dL   HCT 68.6 (L) 63.9 - 53.9 %   MCV 79.0 (L) 80.0 - 100.0 fL   MCH  26.0 26.0 - 34.0 pg   MCHC 32.9 30.0 - 36.0 g/dL   RDW 85.1 88.4 - 84.4 %   Platelets 586 (H) 150 - 400 K/uL   nRBC 0.0 0.0 - 0.2 %    Comment: Performed at [facility], [address] [place], [place] [address]  Urinalysis, Routine w reflex microscopic -Urine, Clean Catch     Status: Abnormal   Collection Time: 09/03/24 12:08 AM  Result Value Ref Range   Color, Urine YELLOW YELLOW   APPearance CLEAR CLEAR   Specific Gravity, Urine >1.030 (H) 1.005 - 1.030   pH 6.0 5.0 - 8.0   Glucose, UA NEGATIVE NEGATIVE mg/dL   Hgb urine dipstick TRACE (A) NEGATIVE   Bilirubin Urine SMALL (A) NEGATIVE   Ketones, ur >80 (A) NEGATIVE mg/dL   Protein, ur 899 (A) NEGATIVE mg/dL   Nitrite POSITIVE (A) NEGATIVE   Leukocytes,Ua NEGATIVE NEGATIVE    Comment: Performed at [facility], [address], [place], [place] [address]  Pregnancy, urine     Status: None   Collection Time: 09/03/24 12:08 AM  Result Value Ref Range   Preg Test, Ur NEGATIVE NEGATIVE    Comment:        THE SENSITIVITY OF THIS METHODOLOGY IS >20 mIU/mL. Performed at [facility], [address] [place], [place]  [address]   Urinalysis, Microscopic (reflex)     Status: Abnormal   Collection Time: 09/03/24 12:08 AM  Result Value Ref Range   RBC / HPF 0-5 0 - 5 RBC/hpf   WBC, UA 6-10 0 - 5 WBC/hpf   Bacteria, UA MANY (A) NONE SEEN   Squamous Epithelial / HPF NONE SEEN 0 - 5 /HPF   Mucus PRESENT    Hyaline Casts, UA PRESENT     Comment: Performed at [facility], [address], [place], [place] [address]  Urine Culture     Status: Abnormal   Collection Time: 09/03/24 12:08 AM   Specimen: Urine, Clean Catch  Result Value Ref Range   Specimen Description      URINE, CLEAN CATCH Performed at [facility], [address] [place], [place] [address]    Special Requests      NONE Performed at [facility], [address] [place], [place] [address]    Culture 60,000 COLONIES/mL ESCHERICHIA COLI (A)    Report Status 09/05/2024 FINAL    Organism ID, Bacteria ESCHERICHIA COLI (A)       Susceptibility   Escherichia coli - MIC*    AMPICILLIN 16 INTERMEDIATE Intermediate     CEFAZOLIN (URINE) Value in next row Sensitive      4 SENSITIVEThis is a modified FDA-approved test that has been validated and its performance characteristics determined by the reporting laboratory.  This laboratory is certified under the Clinical Laboratory Improvement Amendments CLIA as qualified to perform high complexity clinical laboratory testing.    CEFEPIME Value in next row Sensitive      4 SENSITIVEThis is a modified FDA-approved test that has been validated and its performance characteristics determined by the reporting laboratory.  This laboratory is certified under the Clinical Laboratory Improvement Amendments CLIA as qualified to perform high complexity clinical laboratory testing.    ERTAPENEM Value in next row Sensitive      4 SENSITIVEThis is a modified FDA-approved test that has been validated and its performance characteristics determined by the reporting  laboratory.  This laboratory is certified under the Clinical Laboratory Improvement  Amendments CLIA as qualified to perform high complexity clinical laboratory testing.    CEFTRIAXONE  Value in next row Sensitive      4 SENSITIVEThis is a modified FDA-approved test that has been validated and its performance characteristics determined by the reporting laboratory.  This laboratory is certified under the Clinical Laboratory Improvement Amendments CLIA as qualified to perform high complexity clinical laboratory testing.    CIPROFLOXACIN Value in next row Resistant      4 SENSITIVEThis is a modified FDA-approved test that has been validated and its performance characteristics determined by the reporting laboratory.  This laboratory is certified under the Clinical Laboratory Improvement Amendments CLIA as qualified to perform high complexity clinical laboratory testing.    GENTAMICIN Value in next row Sensitive      4 SENSITIVEThis is a modified FDA-approved test that has been validated and its performance characteristics determined by the reporting laboratory.  This laboratory is certified under the Clinical Laboratory Improvement Amendments CLIA as qualified to perform high complexity clinical laboratory testing.    NITROFURANTOIN Value in next row Sensitive      4 SENSITIVEThis is a modified FDA-approved test that has been validated and its performance characteristics determined by the reporting laboratory.  This laboratory is certified under the Clinical Laboratory Improvement Amendments CLIA as qualified to perform high complexity clinical laboratory testing.    TRIMETH/SULFA Value in next row Resistant      4 SENSITIVEThis is a modified FDA-approved test that has been validated and its performance characteristics determined by the reporting laboratory.  This laboratory is certified under the Clinical Laboratory Improvement Amendments CLIA as qualified to perform high complexity clinical laboratory testing.     AMPICILLIN/SULBACTAM Value in next row Sensitive      4 SENSITIVEThis is a modified FDA-approved test that has been validated and its performance characteristics determined by the reporting laboratory.  This laboratory is certified under the Clinical Laboratory Improvement Amendments CLIA as qualified to perform high complexity clinical laboratory testing.    PIP/TAZO Value in next row Sensitive      <=4 SENSITIVEThis is a modified FDA-approved test that has been validated and its performance characteristics determined by the reporting laboratory.  This laboratory is certified under the Clinical Laboratory Improvement Amendments CLIA as qualified to perform high complexity clinical laboratory testing.    MEROPENEM Value in next row Sensitive      <=4 SENSITIVEThis is a modified FDA-approved test that has been validated and its performance characteristics determined by the reporting laboratory.  This laboratory is certified under the Clinical Laboratory Improvement Amendments CLIA as qualified to perform high complexity clinical laboratory testing.    * 60,000 COLONIES/mL ESCHERICHIA COLI  CBC     Status: Abnormal   Collection Time: 09/03/24  5:18 AM  Result Value Ref Range   WBC 10.7 (H) 4.0 - 10.5 K/uL   RBC 3.34 (L) 3.87 - 5.11 MIL/uL   Hemoglobin 9.0 (L) 12.0 - 15.0 g/dL   HCT 73.7 (L) 63.9 - 53.9 %   MCV 78.4 (L) 80.0 - 100.0 fL   MCH 26.9 26.0 - 34.0 pg   MCHC 34.4 30.0 - 36.0 g/dL   RDW 85.0 88.4 - 84.4 %   Platelets 500 (H) 150 - 400 K/uL   nRBC 0.0 0.0 - 0.2 %    Comment: Performed at [facility], [address], [place], [place] [address]  Comprehensive metabolic panel     Status: Abnormal   Collection Time: 09/03/24  5:18  AM  Result Value Ref Range   Sodium 135 135 - 145 mmol/L   Potassium 3.6 3.5 - 5.1 mmol/L   Chloride 101 98 - 111 mmol/L   CO2 23 22 - 32 mmol/L   Glucose, Bld 87 70 - 99 mg/dL    Comment: Glucose reference range applies only to  samples taken after fasting for at least 8 hours.   BUN 11 6 - 20 mg/dL   Creatinine, Ser 9.42 0.44 - 1.00 mg/dL   Calcium  9.0 8.9 - 10.3 mg/dL   Total Protein 6.9 6.5 - 8.1 g/dL   Albumin 2.8 (L) 3.5 - 5.0 g/dL   AST 11 (L) 15 - 41 U/L   ALT 9 0 - 44 U/L   Alkaline Phosphatase 82 38 - 126 U/L   Total Bilirubin 0.3 0.0 - 1.2 mg/dL   GFR, Estimated >39 >39 mL/min    Comment: (NOTE) Calculated using the CKD-EPI Creatinine Equation (2021)    Anion gap 11 5 - 15    Comment: Performed at [facility], [address], [place], [place] [address]  CA 125     Status: Abnormal   Collection Time: 09/03/24  5:18 AM  Result Value Ref Range   Cancer Antigen (CA) 125 39.4 (H) 0.0 - 38.1 U/mL    Comment: (NOTE) Roche [employer]ostics Electrochemiluminescence Immunoassay (ECLIA) Values obtained with different assay methods or kits cannot be used interchangeably.  Results cannot be interpreted as absolute evidence of the presence or absence of malignant disease. Performed At: [facility] [address] [place], [place] [id number] Jennette Shorter MD Ey:[id number]   HIV Antibody (routine testing w rflx)     Status: None   Collection Time: 09/03/24  1:41 PM  Result Value Ref Range   HIV Screen 4th Generation wRfx Non Reactive Non Reactive    Comment: Performed at [facility], [address], [place], [place] [address]  CBC     Status: Abnormal   Collection Time: 09/03/24  1:41 PM  Result Value Ref Range   WBC 11.2 (H) 4.0 - 10.5 K/uL   RBC 3.95 3.87 - 5.11 MIL/uL   Hemoglobin 10.2 (L) 12.0 - 15.0 g/dL   HCT 68.5 (L) 63.9 - 53.9 %   MCV 79.5 (L) 80.0 - 100.0 fL   MCH 25.8 (L) 26.0 - 34.0 pg   MCHC 32.5 30.0 - 36.0 g/dL   RDW 84.9 88.4 - 84.4 %   Platelets 610 (H) 150 - 400 K/uL   nRBC 0.0 0.0 - 0.2 %    Comment: Performed at [facility], [address] [place], [place] [address]  Creatinine, serum     Status: None   Collection Time:  09/03/24  1:41 PM  Result Value Ref Range   Creatinine, Ser 0.69 0.44 - 1.00 mg/dL   GFR, Estimated >39 >39 mL/min    Comment: (NOTE) Calculated using the CKD-EPI Creatinine Equation (2021) Performed at [facility], [address], [place], [place] [address]   CEA     Status: None   Collection Time: 09/03/24  1:41 PM  Result Value Ref Range   CEA 0.8 0.0 - 4.7 ng/mL    Comment: (NOTE)                             Nonsmokers          <3.9  Smokers             <5.6 Roche [employer]ostics Electrochemiluminescence Immunoassay (ECLIA) Values obtained with different assay methods or kits cannot be used interchangeably.  Results cannot be interpreted as absolute evidence of the presence or absence of malignant disease. Performed At: [facility] [address] [place], [place] [id number] Jennette Shorter MD Ey:[id number]   AFP tumor marker     Status: None   Collection Time: 09/03/24  1:41 PM  Result Value Ref Range   AFP, Serum, Tumor Marker <1.8 0.0 - 4.7 ng/mL    Comment: (NOTE) Roche Diagnostics Electrochemiluminescence Immunoassay (ECLIA) Values obtained with different assay methods or kits cannot be used interchangeably.  Results cannot be interpreted as absolute evidence of the presence or absence of malignant disease. This test is not interpretable in pregnant females. Performed At: [facility] [address] [place], [place] [id number] Jennette Shorter MD Ey:[id number]   Cancer antigen 19-9     Status: None   Collection Time: 09/03/24  2:59 PM  Result Value Ref Range   CA 19-9 11 0 - 35 U/mL    Comment: (NOTE) Roche Diagnostics Electrochemiluminescence Immunoassay (ECLIA) Values obtained with different assay methods or kits cannot be used interchangeably.  Results cannot be interpreted as absolute evidence of the presence or absence of malignant disease. Performed At: [facility] [address]  [place], [place] [id number] Jennette Shorter MD Ey:[id number]   Lactate dehydrogenase     Status: Abnormal   Collection Time: 09/03/24  2:59 PM  Result Value Ref Range   LDH 85 (L) 105 - 235 U/L    Comment: Performed at [facility], [address] [place], [place] [address]  Comprehensive metabolic panel     Status: Abnormal   Collection Time: 09/04/24  5:13 AM  Result Value Ref Range   Sodium 137 135 - 145 mmol/L   Potassium 3.9 3.5 - 5.1 mmol/L   Chloride 101 98 - 111 mmol/L   CO2 20 (L) 22 - 32 mmol/L   Glucose, Bld 82 70 - 99 mg/dL    Comment: Glucose reference range applies only to samples taken after fasting for at least 8 hours.   BUN 8 6 - 20 mg/dL   Creatinine, Ser 9.37 0.44 - 1.00 mg/dL   Calcium  9.5 8.9 - 10.3 mg/dL   Total Protein 7.6 6.5 - 8.1 g/dL   Albumin 3.0 (L) 3.5 - 5.0 g/dL   AST 23 15 - 41 U/L   ALT 14 0 - 44 U/L   Alkaline Phosphatase 108 38 - 126 U/L   Total Bilirubin 0.4 0.0 - 1.2 mg/dL   GFR, Estimated >39 >39 mL/min    Comment: (NOTE) Calculated using the CKD-EPI Creatinine Equation (2021)    Anion gap 16 (H) 5 - 15    Comment: Performed at [facility], [address] [place], [place] [address]  CBC     Status: Abnormal   Collection Time: 09/04/24  5:13 AM  Result Value Ref Range   WBC 13.0 (H) 4.0 - 10.5 K/uL   RBC 3.96 3.87 - 5.11 MIL/uL   Hemoglobin 10.2 (L) 12.0 - 15.0 g/dL   HCT 68.0 (L) 63.9 - 53.9 %   MCV 80.6 80.0 - 100.0 fL   MCH 25.8 (L) 26.0 - 34.0 pg   MCHC 32.0 30.0 - 36.0 g/dL   RDW 84.7 88.4 - 84.4 %   Platelets 594 (H) 150 - 400 K/uL  nRBC 0.0 0.0 - 0.2 %    Comment: Performed at [facility], [address] [place], [place] [address]  Body fluid cell count with differential     Status: Abnormal   Collection Time: 09/04/24 12:48 PM  Result Value Ref Range   Fluid Type-FCT CYTO PERI    Color, Fluid YELLOW (A) YELLOW   Appearance, Fluid CLEAR CLEAR   Total Nucleated  Cell Count, Fluid 738 0 - 1,000 cu mm   Neutrophil Count, Fluid 92 (H) 0 - 25 %   Lymphs, Fluid 3 %   Monocyte-Macrophage-Serous Fluid 5 (L) 50 - 90 %   Eos, Fluid 0 %    Comment: Performed at [facility], [address], [place], [place] [address]  Body fluid culture w Gram Stain     Status: None (Preliminary result)   Collection Time: 09/04/24 12:48 PM   Specimen: PATH Cytology Peritoneal fluid  Result Value Ref Range   Specimen Description      PERITONEAL Performed at [facility], [address] [place], [place] [address]    Special Requests      NONE Performed at [facility], [address] [place], [place] [address]    Gram Stain NO WBC SEEN NO ORGANISMS SEEN     Culture      NO GROWTH < 24 HOURS Performed at [facility], [address], [place], [place] [address]    Report Status PENDING   LD, Body Fluid (other)     Status: None   Collection Time: 09/04/24 12:48 PM  Result Value Ref Range   Source of Sample PERITONEAL     Comment: Performed at [facility], [address] [place], [place] [address]   LD, Body Fluid 261 IU/L    Comment: (NOTE)             _________________________________________            : BODY FLUID TYPE :          LDH          :            :_________________:_______________________:            :                 : Nonmalignant:  < 60%  :            :                 :  of the serum LDH     :            : Ascitic Fluid   : Malignant:     > 60%  :            :                 :  of the serum LDH     :            :_________________:_______________________:            : Gastric Juice   :                < 35   :            :_________________:_______________________:            :                 :  Transudate:    <200   :            : Pleural Fluid   : Exudate:       >200   :            :_________________:_______________________:            : Saliva          :            113 - 609   :            : (Mixed Glands)  :                       :            :_________________:_______________________:            : Synovial Fluid  :                <240   :            :_________________:___________ ____________:             Bronwen ORN, Gwynn GAILS. Reference Intervals             for Adults and Children 2008. Ninth             Edition (V9.1) [employer],             [place]; [place]: July 2009. Performed At: [facility] [address] [place], [place] [id number] Jennette Shorter MD Ey:[id number]   Glucose, Body Fluid Other     Status: None   Collection Time: 09/04/24 12:48 PM  Result Value Ref Range   Glucose, Body Fluid Other 55 mg/dL    Comment: (NOTE)             _________________________________________            : BODY FLUID TYPE :        GLUCOSE        :            :_________________:_______________________:            : Amniotic Fluid  :        45 - 76        :            :_________________:_______________________:            : Bile, Clear     :            < 5        :            :_________________:_______________________:            : Bile, Yellow    :            < 8        :            :_________________:_______________________:            : Lymph           :        48 - 200       :            :_________________:_______________________:            : Nasal Secretion :            < 10       :            :  _________________:_______________________:            : Pleural Fluid   :        65 -  99       :            :_________________:_______________________:            : Saliva          :            <  2       :            : (Mixed Glands)  :                       :            :_________________:___________ ____________:            : Sweat           :            <  7       :            :_________________:_______________________:            : Synovial Fluid  :        65 -  99       :             :_________________:_______________________:            : Tears           :        76 - 288       :            :_________________:_______________________:             Bronwen ORN, Ehrhardt V. Reference Intervals             for Adults and Children 2008. Ninth             edition (V9.1) [employer],             [place]; [place]: July 2009. Performed At: [facility] [address] [place], [place] [id number] Jennette Shorter MD Ey:[id number]    Source of Sample PERITONEAL     Comment: Performed at [facility], [address], [place], [place] [address]    MR PELVIS W WO CONTRAST Result Date: 09/04/2024 CLINICAL DATA:  Multiseptated left adnexal cystic lesion. Large volume ascites status post paracentesis EXAM: MRI PELVIS WITHOUT AND WITH CONTRAST TECHNIQUE: Multiplanar multisequence MR imaging of the pelvis was performed both before and after administration of intravenous contrast. CONTRAST:  5mL GADAVIST  GADOBUTROL  1 MMOL/ML IV SOLN COMPARISON:  CT abdomen and pelvis dated 09/03/2024 FINDINGS: Urinary Tract:  No abnormality visualized. Bowel:  Unremarkable visualized pelvic bowel loops. Vascular/Lymphatic: No pathologically enlarged lymph nodes. No significant vascular abnormality seen. Reproductive: Uterus measures 8.4 cm in sagittal dimension. The endometrium measures 10 mm. Low-lying intrauterine device is again seen within the lower uterine segment extending into the cervix. The arms of the IUD appear to extend beyond the cervical serosa (7:21, 22). T2 hypointense mass at the uterine fundus measures 2.2 x 1.5 x 1.6 cm (3:18, 9:27), likely leiomyoma. Right ovary (2:15) contains an intrinsically T1 hyperintense 9 x 7 mm ovoid focus (15:25). Nonenhancing Intrinsically T1 hyperintense left adnexal cystic structure measures 7.0 x 4.4 cm (15:25) and demonstrates internal layering fluid level. Within the medial  aspect of this cystic structure, there are 2  heterogeneously T1 hyperintense rounded foci measuring 3.1 x 2.4 cm anteriorly (15:17) and 1.5 x 1.0 cm posteriorly (15:15). These lesions demonstrate marked diffusion restriction. No intrinsic fat signal. Other: Partially imaged ventriculoperitoneal shunt catheter. Interval decreased small volume ascites status post paracentesis. The peritoneal ascites extends into the right posterior pelvis in a slightly serpentine fashion. Persistent diffuse peritoneal thickening and enhancement. Mild T2 hypointensity within the dependent posterior pelvis measures 1.9 x 1.2 cm (3:20), demonstrating diffusion restriction. Partially imaged fluid collection along the right paracolic gutter measures 3.0 x 2.0 cm (3:13), previously 4.8 x 1.6 cm. Musculoskeletal: No suspicious bone lesions identified. Mild body wall edema. IMPRESSION: 1. Nonenhancing left adnexal cystic structure measures 7.0 x 4.4 cm and demonstrates internal layering fluid level and contains 2 rounded foci which demonstrate marked diffusion restriction. Findings are favored to represent endometriomas with hemorrhage. Malignant degeneration is not excluded. 2. Right ovary contains an intrinsically T1 hyperintense 9 x 7 mm ovoid focus, likely an additional small endometrioma. 3. Findings of peritonitis with decreased small volume ascites status post paracentesis. Mild T2 hypointensity within the dependent posterior pelvis may represent layering infectious/inflammatory debris or an endometrial implant. 4. Low-lying intrauterine device within the lower uterine segment extending into the cervix. The arms of the IUD appear to extend beyond the cervical serosa. Electronically Signed   By: Limin  Xu M.D.   On: 09/04/2024 14:38   US  Paracentesis Result Date: 09/04/2024 INDICATION: Patient admitted with abdominal pain and distention, found to have new onset ascites concerning for possible malignancy. Request for diagnostic and therapeutic paracentesis. EXAM: ULTRASOUND  GUIDED DIAGNOSTIC AND THERAPEUTIC PARACENTESIS MEDICATIONS: 8 mL 1% lidocaine  COMPLICATIONS: None immediate. PROCEDURE: Informed written consent was obtained from the patient's mother Crystal Greene after a discussion of the risks, benefits and alternatives to treatment. A timeout was performed prior to the initiation of the procedure. Initial ultrasound scanning demonstrates a large amount of ascites with multiple septations within the right and left lower abdominal quadrants. The right lower abdomen was prepped and draped in the usual sterile fashion. 1% lidocaine  was used for local anesthesia. Following this, a 19 gauge, 7-cm, Yueh catheter was introduced. An ultrasound image was saved for documentation purposes. The paracentesis was performed. The catheter was removed and a dressing was applied. The patient tolerated the procedure well without immediate post procedural complication. FINDINGS: A total of approximately 2.2 L of clear yellow fluid was removed. Samples were sent to the laboratory as requested by the clinical team. IMPRESSION: Successful ultrasound-guided paracentesis yielding 2.2 liters of peritoneal fluid. Electronically Signed   By: Cordella Banner   On: 09/04/2024 13:44   CT ABDOMEN PELVIS W CONTRAST Result Date: 09/03/2024 EXAM: CT ABDOMEN AND PELVIS WITH CONTRAST 09/03/2024 10:28:08 PM TECHNIQUE: CT of the abdomen and pelvis was performed with the administration of intravenous contrast. Multiplanar reformatted images are provided for review. Automated exposure control, iterative reconstruction, and/or weight-based adjustment of the mA/kV was utilized to reduce the radiation dose to as low as reasonably achievable. 75 mL of iohexol  (OMNIPAQUE ) 300 MG/ML solution was administered. COMPARISON: Ultrasound pelvis 09/03/2024, abdominal radiograph 09/03/2024, CT abdomen and pelvis 08/30/2024. CLINICAL HISTORY: Adnexal mass. FINDINGS: LOWER CHEST: Mild atelectasis in the lung bases. Developing  pleural thickening with possible developing loculation of some of the fluid. LIVER: Low attenuation changes in the left lobe of the liver probably represent areas of focal fatty infiltration. No change since prior study. GALLBLADDER AND BILE DUCTS: Gallbladder,  bile ducts, and pancreas are unremarkable. SPLEEN: Spleen is normal. PANCREAS: Gallbladder, bile ducts, and pancreas are unremarkable. ADRENAL GLANDS: Adrenal glands are normal. KIDNEYS, URETERS AND BLADDER: Kidneys are normal. No stones in the kidneys or ureters. No hydronephrosis. No perinephric or periureteral stranding. Urinary bladder is unremarkable. GI AND BOWEL: The stomach, small bowel, and colon are not abnormally distended. Contrast material flows through to the rectum without evidence of obstruction. Appendix is not identified. PERITONEUM AND RETROPERITONEUM: Tubing demonstrated in the anterior chest/abdominal wall extending into the abdomen and pelvis consistent with ventriculoperitoneal shunt tubing. Large amount of fluid throughout the abdomen and pelvis, increasing since previous study. This may indicate peritonitis or possibly developing abscess. No free air. VASCULATURE: Normal caliber abdominal aorta. No aneurysm or dissection. LYMPH NODES: No lymphadenopathy. REPRODUCTIVE ORGANS: Nodular myometrial appearance consistent with fibroid. Intrauterine device is demonstrated in the lower uterine segment/endocervical region. Complex multiseptated cystic lesion in the left adnexa, measuring 6.2 cm in diameter. The left adnexal mass was seen on prior CT but not visualized at ultrasound. MRI is recommended for further evaluation. Malignancy with peritoneal metastatic disease is not excluded. BONES AND SOFT TISSUES: No acute osseous abnormality. No focal soft tissue abnormality. Tubing demonstrated in the anterior chest/abdominal wall extending into the abdomen and pelvis consistent with ventriculoperitoneal shunt tubing. IMPRESSION: 1. Complex  multiseptated cystic lesion in the left adnexa measuring 6.2 cm, seen on prior CT but not visualized at ultrasound. MRI is recommended for further evaluation. Malignancy with peritoneal metastatic disease is not excluded. 2. Large amount of fluid throughout the abdomen and pelvis, increasing since previous study, with developing peritoneal thickening and possible loculation, suggestive of peritonitis or developing abscess. Electronically signed by: Elsie Gravely MD 09/03/2024 10:44 PM EST RP Workstation: [id number]      [1]  Allergies Allergen Reactions   Shellfish Allergy Swelling    Throat swells   Fish Allergy Swelling    Angioedema after eating tilapia - positive skin prick testing   Other Swelling     Positive skin prick testing to tree nuts.    "

## 2024-09-05 NOTE — Progress Notes (Signed)
 " PROGRESS NOTE    Crystal Greene  FMW:[id number] DOB: 09-29-2000 DOA: 09/02/2024 PCP: Ozell Heron HERO, MD   Brief Narrative:  24 y.o. 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  Family brought her to the ER due to worsening abdominal distention and pain.  They denied any nausea vomiting.  Her last bowel movement was 4 days pta. She does complain of urinary frequency and dysuria.  Has a history of frequent UTI. CT abdomen pelvis with contrast done on December 30 shows Distended small bowel with wall thickening and fold thickening, likely due to enteritis but could indicate early or partial small bowel obstruction. Contrast material is not to the colon yet. .Moderate diffuse abdominal and pelvic ascites, progressing since the prior study.Complex septated left adnexal cystic mass measuring 4.2 x 5.5 cm. Pelvic ultrasound is recommended for further evaluation.Low-position intrauterine device in the lower uterine segment/endocervical region. No history of decreased appetite or weight loss. ED Course: Received IV fluids morphine  and Rocephin .  Assessment & Plan:   Principal Problem:   Adnexal cyst Active Problems:   Ascites  #1  Abdominal pain/new onset ascites/adnexal mass initially she presented as possible partial small bowel obstruction  seen by surgery patient was passing gas.  A CT of the abdomen done 09/04/2024 does not show any evidence of obstruction.  She is status post 2 L of paracentesis.  Appreciate interventional radiology. Follow-up on the studies culture cytology cell count.  Started on Zosyn  for concern for peritonitis versus abscesses by CT done. 1/5 her abdomen looks more distended today appears to fluid have reaccumulated.  Consult IR again for repeat therapeutic paracentesis.  But limited ultrasound showed small amount of ascites in the right lower quadrant and deep pelvic regions which is complex multiseptated loculated with bowel loops in close proximity.Therefore paracentesis was not done today. Pain control with Dilaudid  and morphine  Dilaudid  dose is increased to Q1. I discussed with [facility] transfer center and spoke with Dr. Clarence pediatric neurosurgeon and Dr. Elaina hospitalist who has accepted the patient in transfer but remains on a waiting list since they are at bed capacity.  They will call as soon as the bed is available.  I truly appreciate them. Concern for recurrent abdominal ascites/?  Infected ascites?  Peritonitis due to VP shunt malfunction?  Infected shunt  # 2 VP Shunt patient has extensive history of VP shunt revision at [facility] by Dr. Jodie.  Shunt was programmed after MRI.  Discussed with Dr. Budge who thinks patient needs externalization and needs transfer to [facility].  # 3 UTI ua cw with uti with symptoms  urine culture with gram-negative rods sensitivity pending.  On Zosyn  to cover possible peritonitis.   # 4 dehydration ua with ketones continue IVF   Estimated body mass index is 20.9 kg/m as calculated from the following:   Height as of this encounter: 5' (1.524 m).   Weight as of this encounter:  48.5 kg.  DVT prophylaxis: lovenox  Code Status: full Family Communication: dw family Disposition Plan:  Status is: Inpatient Remains inpatient appropriate because: acute illness   Consultants:  surgery gyn IR   Procedures:paracentesis  Antimicrobials:zosyn   Subjective: Patient in distress due to pain abdomen distended much more than yesterday  Objective: Vitals:   09/04/24 2235 09/05/24 0545 09/05/24 1212 09/05/24 1311  BP: 109/71 118/80  116/78  Pulse:    (!) 108  Resp: 15 14  18   Temp: 98.3 F (36.8 C) 98 F (36.7 C) 98.1 F (36.7 C) 98 F (36.7 C)  TempSrc: Oral Oral Oral Oral  SpO2: 93% 94%  95%  Weight:      Height:        Intake/Output Summary (Last 24 hours) at 09/05/2024 1500 Last data filed at 09/05/2024 0550 Gross per 24 hour  Intake 2090.15 ml  Output --  Net 2090.15 ml   Filed Weights   09/02/24 1814  Weight: 48.5 kg    Examination:  General exam: Appears in distress  respiratory system: Clear to auscultation. Respiratory effort normal. Cardiovascular system: S1 & S2 heard, RRR. No JVD, murmurs, rubs, gallops or clicks. No pedal edema. Gastrointestinal system: Abdomen is distended,  and generalized tender. Central nervous system: Alert and oriented. No focal neurological deficits. Extremities: no edema   Data Reviewed: I have personally reviewed following labs and imaging studies  CBC: Recent Labs  Lab 09/02/24 1817 09/03/24 0518 09/03/24 1341 09/04/24 0513  WBC 11.9* 10.7* 11.2* 13.0*  HGB 10.3* 9.0* 10.2* 10.2*  HCT 31.3* 26.2* 31.4* 31.9*  MCV 79.0* 78.4* 79.5* 80.6  PLT 586* 500* 610* 594*   Basic Metabolic Panel: Recent Labs  Lab 09/02/24 1817 09/03/24 0518 09/03/24 1341 09/04/24 0513  NA 136 135  --  137  K 3.6 3.6  --  3.9  CL 97* 101  --  101  CO2 24 23  --  20*  GLUCOSE 83 87  --  82  BUN 16 11  --  8  CREATININE 0.67 0.57 0.69 0.62  CALCIUM  9.8 9.0  --  9.5   GFR: Estimated Creatinine Clearance: 79.2 mL/min (by C-G formula based on SCr of 0.62 mg/dL). Liver Function Tests: Recent Labs  Lab 09/02/24 1817 09/03/24 0518 09/04/24 0513  AST 11* 11* 23  ALT 9 9 14   ALKPHOS 108 82 108  BILITOT 0.4 0.3 0.4   PROT 8.0 6.9 7.6  ALBUMIN 3.5 2.8* 3.0*   Recent Labs  Lab 09/02/24 1817  LIPASE 28   No results for input(s): AMMONIA in the last 168 hours. Coagulation Profile: No results for input(s): INR, PROTIME in the last 168 hours. Cardiac Enzymes: No results for input(s): CKTOTAL, CKMB, CKMBINDEX, TROPONINI in the last 168 hours. BNP (last 3 results) No results for input(s): PROBNP in the last 8760 hours. HbA1C: No results for input(s): HGBA1C in the last 72 hours. CBG: No results for input(s): GLUCAP in the last 168 hours. Lipid Profile: No results for input(s): CHOL, HDL, LDLCALC, TRIG, CHOLHDL, LDLDIRECT in the last 72 hours. Thyroid Function Tests: No results for input(s): TSH, T4TOTAL, FREET4, T3FREE, THYROIDAB in the last 72 hours. Anemia Panel: No results for input(s): VITAMINB12, FOLATE, FERRITIN, TIBC, IRON, RETICCTPCT in the last 72 hours. Sepsis Labs: No results for input(s): PROCALCITON, LATICACIDVEN in the last 168 hours.  Recent Results (from the past 240 hours)  Urine Culture     Status: Abnormal   Collection Time: 09/03/24 12:08 AM  Specimen: Urine, Clean Catch  Result Value Ref Range Status   Specimen Description   Final    URINE, CLEAN CATCH Performed at [facility], [address] [place], [place] [address]    Special Requests   Final    NONE Performed at [facility], [address], [place], [place] [address]    Culture 60,000 COLONIES/mL ESCHERICHIA COLI (A)  Final   Report Status 09/05/2024 FINAL  Final   Organism ID, Bacteria ESCHERICHIA COLI (A)  Final      Susceptibility   Escherichia coli - MIC*    AMPICILLIN 16 INTERMEDIATE Intermediate     CEFAZOLIN (URINE) Value in next row Sensitive      4 SENSITIVEThis is a modified FDA-approved test that has been validated and its performance characteristics determined by the reporting laboratory.  This  laboratory is certified under the Clinical Laboratory Improvement Amendments CLIA as qualified to perform high complexity clinical laboratory testing.    CEFEPIME Value in next row Sensitive      4 SENSITIVEThis is a modified FDA-approved test that has been validated and its performance characteristics determined by the reporting laboratory.  This laboratory is certified under the Clinical Laboratory Improvement Amendments CLIA as qualified to perform high complexity clinical laboratory testing.    ERTAPENEM Value in next row Sensitive      4 SENSITIVEThis is a modified FDA-approved test that has been validated and its performance characteristics determined by the reporting laboratory.  This laboratory is certified under the Clinical Laboratory Improvement Amendments CLIA as qualified to perform high complexity clinical laboratory testing.    CEFTRIAXONE  Value in next row Sensitive      4 SENSITIVEThis is a modified FDA-approved test that has been validated and its performance characteristics determined by the reporting laboratory.  This laboratory is certified under the Clinical Laboratory Improvement Amendments CLIA as qualified to perform high complexity clinical laboratory testing.    CIPROFLOXACIN Value in next row Resistant      4 SENSITIVEThis is a modified FDA-approved test that has been validated and its performance characteristics determined by the reporting laboratory.  This laboratory is certified under the Clinical Laboratory Improvement Amendments CLIA as qualified to perform high complexity clinical laboratory testing.    GENTAMICIN Value in next row Sensitive      4 SENSITIVEThis is a modified FDA-approved test that has been validated and its performance characteristics determined by the reporting laboratory.  This laboratory is certified under the Clinical Laboratory Improvement Amendments CLIA as qualified to perform high complexity clinical laboratory testing.    NITROFURANTOIN Value in  next row Sensitive      4 SENSITIVEThis is a modified FDA-approved test that has been validated and its performance characteristics determined by the reporting laboratory.  This laboratory is certified under the Clinical Laboratory Improvement Amendments CLIA as qualified to perform high complexity clinical laboratory testing.    TRIMETH/SULFA Value in next row Resistant      4 SENSITIVEThis is a modified FDA-approved test that has been validated and its performance characteristics determined by the reporting laboratory.  This laboratory is certified under the Clinical Laboratory Improvement Amendments CLIA as qualified to perform high complexity clinical laboratory testing.    AMPICILLIN/SULBACTAM Value in next row Sensitive      4 SENSITIVEThis is a modified FDA-approved test that has been validated and its performance characteristics determined by the reporting laboratory.  This laboratory is certified under the Clinical Laboratory Improvement Amendments CLIA as qualified to  perform high complexity clinical laboratory testing.    PIP/TAZO Value in next row Sensitive      <=4 SENSITIVEThis is a modified FDA-approved test that has been validated and its performance characteristics determined by the reporting laboratory.  This laboratory is certified under the Clinical Laboratory Improvement Amendments CLIA as qualified to perform high complexity clinical laboratory testing.    MEROPENEM Value in next row Sensitive      <=4 SENSITIVEThis is a modified FDA-approved test that has been validated and its performance characteristics determined by the reporting laboratory.  This laboratory is certified under the Clinical Laboratory Improvement Amendments CLIA as qualified to perform high complexity clinical laboratory testing.    * 60,000 COLONIES/mL ESCHERICHIA COLI  Body fluid culture w Gram Stain     Status: None (Preliminary result)   Collection Time: 09/04/24 12:48 PM   Specimen: PATH Cytology Peritoneal  fluid  Result Value Ref Range Status   Specimen Description   Final    PERITONEAL Performed at [facility], [address], [place], [place] [address]    Special Requests   Final    NONE Performed at [facility], [address], [place], [place] [address]    Gram Stain NO WBC SEEN NO ORGANISMS SEEN   Final   Culture   Final    NO GROWTH < 24 HOURS Performed at [facility], [address], [place], [place] [address]    Report Status PENDING  Incomplete         Radiology Studies: MR PELVIS W WO CONTRAST Result Date: 09/04/2024 CLINICAL DATA:  Multiseptated left adnexal cystic lesion. Large volume ascites status post paracentesis EXAM: MRI PELVIS WITHOUT AND WITH CONTRAST TECHNIQUE: Multiplanar multisequence MR imaging of the pelvis was performed both before and after administration of intravenous contrast. CONTRAST:  5mL GADAVIST  GADOBUTROL  1 MMOL/ML IV SOLN COMPARISON:  CT abdomen and pelvis dated 09/03/2024 FINDINGS: Urinary Tract:  No abnormality visualized. Bowel:  Unremarkable visualized pelvic bowel loops. Vascular/Lymphatic: No pathologically enlarged lymph nodes. No significant vascular abnormality seen. Reproductive: Uterus measures 8.4 cm in sagittal dimension. The endometrium measures 10 mm. Low-lying intrauterine device is again seen within the lower uterine segment extending into the cervix. The arms of the IUD appear to extend beyond the cervical serosa (7:21, 22). T2 hypointense mass at the uterine fundus measures 2.2 x 1.5 x 1.6 cm (3:18, 9:27), likely leiomyoma. Right ovary (2:15) contains an intrinsically T1 hyperintense 9 x 7 mm ovoid focus (15:25). Nonenhancing Intrinsically T1 hyperintense left adnexal cystic structure measures 7.0 x 4.4 cm (15:25) and demonstrates internal layering fluid level. Within the medial aspect of this cystic structure, there are 2 heterogeneously T1 hyperintense rounded foci measuring 3.1 x 2.4 cm  anteriorly (15:17) and 1.5 x 1.0 cm posteriorly (15:15). These lesions demonstrate marked diffusion restriction. No intrinsic fat signal. Other: Partially imaged ventriculoperitoneal shunt catheter. Interval decreased small volume ascites status post paracentesis. The peritoneal ascites extends into the right posterior pelvis in a slightly serpentine fashion. Persistent diffuse peritoneal thickening and enhancement. Mild T2 hypointensity within the dependent posterior pelvis measures 1.9 x 1.2 cm (3:20), demonstrating diffusion restriction. Partially imaged fluid collection along the right paracolic gutter measures 3.0 x 2.0 cm (3:13), previously 4.8 x 1.6 cm. Musculoskeletal: No suspicious bone lesions identified. Mild body wall edema. IMPRESSION: 1. Nonenhancing left adnexal cystic structure measures 7.0 x 4.4 cm and demonstrates internal layering fluid level and contains 2 rounded foci which demonstrate marked  diffusion restriction. Findings are favored to represent endometriomas with hemorrhage. Malignant degeneration is not excluded. 2. Right ovary contains an intrinsically T1 hyperintense 9 x 7 mm ovoid focus, likely an additional small endometrioma. 3. Findings of peritonitis with decreased small volume ascites status post paracentesis. Mild T2 hypointensity within the dependent posterior pelvis may represent layering infectious/inflammatory debris or an endometrial implant. 4. Low-lying intrauterine device within the lower uterine segment extending into the cervix. The arms of the IUD appear to extend beyond the cervical serosa. Electronically Signed   By: Limin  Xu M.D.   On: 09/04/2024 14:38   US  Paracentesis Result Date: 09/04/2024 INDICATION: Patient admitted with abdominal pain and distention, found to have new onset ascites concerning for possible malignancy. Request for diagnostic and therapeutic paracentesis. EXAM: ULTRASOUND GUIDED DIAGNOSTIC AND THERAPEUTIC PARACENTESIS MEDICATIONS: 8 mL 1%  lidocaine  COMPLICATIONS: None immediate. PROCEDURE: Informed written consent was obtained from the patient's mother Vercie Pokorny after a discussion of the risks, benefits and alternatives to treatment. A timeout was performed prior to the initiation of the procedure. Initial ultrasound scanning demonstrates a large amount of ascites with multiple septations within the right and left lower abdominal quadrants. The right lower abdomen was prepped and draped in the usual sterile fashion. 1% lidocaine  was used for local anesthesia. Following this, a 19 gauge, 7-cm, Yueh catheter was introduced. An ultrasound image was saved for documentation purposes. The paracentesis was performed. The catheter was removed and a dressing was applied. The patient tolerated the procedure well without immediate post procedural complication. FINDINGS: A total of approximately 2.2 L of clear yellow fluid was removed. Samples were sent to the laboratory as requested by the clinical team. IMPRESSION: Successful ultrasound-guided paracentesis yielding 2.2 liters of peritoneal fluid. Electronically Signed   By: Cordella Banner   On: 09/04/2024 13:44   CT ABDOMEN PELVIS W CONTRAST Result Date: 09/03/2024 EXAM: CT ABDOMEN AND PELVIS WITH CONTRAST 09/03/2024 10:28:08 PM TECHNIQUE: CT of the abdomen and pelvis was performed with the administration of intravenous contrast. Multiplanar reformatted images are provided for review. Automated exposure control, iterative reconstruction, and/or weight-based adjustment of the mA/kV was utilized to reduce the radiation dose to as low as reasonably achievable. 75 mL of iohexol  (OMNIPAQUE ) 300 MG/ML solution was administered. COMPARISON: Ultrasound pelvis 09/03/2024, abdominal radiograph 09/03/2024, CT abdomen and pelvis 08/30/2024. CLINICAL HISTORY: Adnexal mass. FINDINGS: LOWER CHEST: Mild atelectasis in the lung bases. Developing pleural thickening with possible developing loculation of some of  the fluid. LIVER: Low attenuation changes in the left lobe of the liver probably represent areas of focal fatty infiltration. No change since prior study. GALLBLADDER AND BILE DUCTS: Gallbladder, bile ducts, and pancreas are unremarkable. SPLEEN: Spleen is normal. PANCREAS: Gallbladder, bile ducts, and pancreas are unremarkable. ADRENAL GLANDS: Adrenal glands are normal. KIDNEYS, URETERS AND BLADDER: Kidneys are normal. No stones in the kidneys or ureters. No hydronephrosis. No perinephric or periureteral stranding. Urinary bladder is unremarkable. GI AND BOWEL: The stomach, small bowel, and colon are not abnormally distended. Contrast material flows through to the rectum without evidence of obstruction. Appendix is not identified. PERITONEUM AND RETROPERITONEUM: Tubing demonstrated in the anterior chest/abdominal wall extending into the abdomen and pelvis consistent with ventriculoperitoneal shunt tubing. Large amount of fluid throughout the abdomen and pelvis, increasing since previous study. This may indicate peritonitis or possibly developing abscess. No free air. VASCULATURE: Normal caliber abdominal aorta. No aneurysm or dissection. LYMPH NODES: No lymphadenopathy. REPRODUCTIVE ORGANS: Nodular myometrial appearance consistent with fibroid. Intrauterine device  is demonstrated in the lower uterine segment/endocervical region. Complex multiseptated cystic lesion in the left adnexa, measuring 6.2 cm in diameter. The left adnexal mass was seen on prior CT but not visualized at ultrasound. MRI is recommended for further evaluation. Malignancy with peritoneal metastatic disease is not excluded. BONES AND SOFT TISSUES: No acute osseous abnormality. No focal soft tissue abnormality. Tubing demonstrated in the anterior chest/abdominal wall extending into the abdomen and pelvis consistent with ventriculoperitoneal shunt tubing. IMPRESSION: 1. Complex multiseptated cystic lesion in the left adnexa measuring 6.2 cm, seen  on prior CT but not visualized at ultrasound. MRI is recommended for further evaluation. Malignancy with peritoneal metastatic disease is not excluded. 2. Large amount of fluid throughout the abdomen and pelvis, increasing since previous study, with developing peritoneal thickening and possible loculation, suggestive of peritonitis or developing abscess. Electronically signed by: Elsie Gravely MD 09/03/2024 10:44 PM EST RP Workstation: [id number]     Scheduled Meds:  enoxaparin  (LOVENOX ) injection  30 mg Subcutaneous Daily   Continuous Infusions:  sodium chloride  100 mL/hr at 09/05/24 1448   sodium chloride  Stopped (09/05/24 0547)   piperacillin -tazobactam (ZOSYN )  IV 3.375 g (09/05/24 1210)     LOS: 2 days    Crystal KANDICE Hoots, MD 09/05/2024, 3:00 PM   "

## 2024-09-05 NOTE — Plan of Care (Signed)
  Problem: Clinical Measurements: Goal: Ability to maintain clinical measurements within normal limits will improve Outcome: Progressing   Problem: Activity: Goal: Risk for activity intolerance will decrease Outcome: Progressing   Problem: Nutrition: Goal: Adequate nutrition will be maintained Outcome: Progressing   Problem: Elimination: Goal: Will not experience complications related to bowel motility Outcome: Progressing Goal: Will not experience complications related to urinary retention Outcome: Progressing   Problem: Pain Managment: Goal: General experience of comfort will improve and/or be controlled Outcome: Progressing   Problem: Safety: Goal: Ability to remain free from injury will improve Outcome: Progressing

## 2024-09-05 NOTE — Progress Notes (Signed)
 Chart reviewed.  Findings from labs and imaging reviewed as well.  Currently no surgical issues.  Will defer further work up and care to GYN/medicine.  We will sign off at this time.  Burnard FORBES Banter, PA-C  7:28 AM 09/05/2024

## 2024-09-05 NOTE — Plan of Care (Signed)

## 2024-09-06 DIAGNOSIS — N390 Urinary tract infection, site not specified: Secondary | ICD-10-CM | POA: Diagnosis present

## 2024-09-06 LAB — COMPREHENSIVE METABOLIC PANEL WITH GFR
ALT: 27 U/L (ref 0–44)
AST: 28 U/L (ref 15–41)
Albumin: 2.7 g/dL — ABNORMAL LOW (ref 3.5–5.0)
Alkaline Phosphatase: 104 U/L (ref 38–126)
Anion gap: 9 (ref 5–15)
BUN: <5 mg/dL — ABNORMAL LOW (ref 6–20)
CO2: 25 mmol/L (ref 22–32)
Calcium: 8.9 mg/dL (ref 8.9–10.3)
Chloride: 101 mmol/L (ref 98–111)
Creatinine, Ser: 0.56 mg/dL (ref 0.44–1.00)
GFR, Estimated: >60 mL/min
Glucose, Bld: 83 mg/dL (ref 70–99)
Potassium: 3.4 mmol/L — ABNORMAL LOW (ref 3.5–5.1)
Sodium: 135 mmol/L (ref 135–145)
Total Bilirubin: 0.4 mg/dL (ref 0.0–1.2)
Total Protein: 6.4 g/dL — ABNORMAL LOW (ref 6.5–8.1)

## 2024-09-06 LAB — CBC
HCT: 29.4 % — ABNORMAL LOW (ref 36.0–46.0)
Hemoglobin: 9.4 g/dL — ABNORMAL LOW (ref 12.0–15.0)
MCH: 25.5 pg — ABNORMAL LOW (ref 26.0–34.0)
MCHC: 32 g/dL (ref 30.0–36.0)
MCV: 79.7 fL — ABNORMAL LOW (ref 80.0–100.0)
Platelets: 595 K/uL — ABNORMAL HIGH (ref 150–400)
RBC: 3.69 MIL/uL — ABNORMAL LOW (ref 3.87–5.11)
RDW: 15.5 % (ref 11.5–15.5)
WBC: 7.9 K/uL (ref 4.0–10.5)
nRBC: 0 % (ref 0.0–0.2)

## 2024-09-06 LAB — CYTOLOGY - NON PAP

## 2024-09-06 MED ORDER — POTASSIUM CHLORIDE CRYS ER 20 MEQ PO TBCR
40.0000 meq | EXTENDED_RELEASE_TABLET | Freq: Once | ORAL | Status: AC
  Administered 2024-09-06: 40 meq via ORAL
  Filled 2024-09-06: qty 2

## 2024-09-06 MED ORDER — CALCIUM CARBONATE ANTACID 500 MG PO CHEW
1.0000 | CHEWABLE_TABLET | Freq: Two times a day (BID) | ORAL | Status: DC
  Administered 2024-09-06 – 2024-09-07 (×3): 200 mg via ORAL
  Filled 2024-09-06 (×3): qty 1

## 2024-09-06 MED ORDER — BISACODYL 5 MG PO TBEC
5.0000 mg | DELAYED_RELEASE_TABLET | Freq: Every day | ORAL | Status: DC | PRN
  Administered 2024-09-06: 5 mg via ORAL
  Filled 2024-09-06 (×2): qty 1

## 2024-09-06 MED ORDER — OXYCODONE HCL 5 MG PO TABS
5.0000 mg | ORAL_TABLET | ORAL | Status: DC | PRN
  Administered 2024-09-06 – 2024-09-07 (×2): 5 mg via ORAL
  Filled 2024-09-06 (×3): qty 1

## 2024-09-06 MED ORDER — DOCUSATE SODIUM 100 MG PO CAPS
100.0000 mg | ORAL_CAPSULE | Freq: Two times a day (BID) | ORAL | Status: DC
  Administered 2024-09-06 – 2024-09-07 (×4): 100 mg via ORAL
  Filled 2024-09-06 (×4): qty 1

## 2024-09-06 MED ORDER — POLYETHYLENE GLYCOL 3350 17 G PO PACK
17.0000 g | PACK | Freq: Every day | ORAL | Status: DC
  Administered 2024-09-06 – 2024-09-07 (×2): 17 g via ORAL
  Filled 2024-09-06 (×2): qty 1

## 2024-09-06 NOTE — Assessment & Plan Note (Addendum)
 Complex shunt history, with last revisions in 2015 Followed at [facility] She has no symptoms of a proximal shunt malfunction/occlusion nor meningitis, per neurosurgery Underwent VP shunt reprogramming on 1/4 She appears likely to need an externalization of her peritoneal shunt catheter, further investigation and treatment of the source of her abdominal/pelvic abnormalities, and eventually revision of her shunt Given the difficulties she had with revision of her shunt in 2015, neurosurgery has recommended transfer to [facility] for further evaluation and management of this complex problem She is awaiting bed availability at [facility] Alternatively, if her pain is controlled she may be able to be discharged to [facility] with short-term neurosurgery f/u there with her team PO meds were added today and she is encouraged to try to limit IV meds unless needed for breakthrough

## 2024-09-06 NOTE — Assessment & Plan Note (Addendum)
 MRI confirms diagnosis of endometrioma, which is a benign condition Cytology of fluid is pending but not obviously infected as of now Endometrioma is very unlikely to be the underlying etiology of the ascites Tumor markers within normal range Conservative management of endometrioma would include management with NSAIDs and hormonal intervention GYN recommends outpatient replacement of Mirena IUD She is pending transfer to [facility] to allow for multi-disciplinary approach

## 2024-09-06 NOTE — Assessment & Plan Note (Addendum)
 ID following  Continuing Zosyn  for now

## 2024-09-06 NOTE — Progress Notes (Signed)
 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  12/30 CT with possible early SBO, but this appears to have been ruled out.  + ascites.  4.2 c 5.5 cm L adnexal cystic mass.  Pelvic US  on 1/3 with large volume of complex, septated pelvic fluid.  IUD in place.  Similar findings on 1/3 CT.  Paracentesis performed on 1/4 with 2.2L clear yellow fluid removed; repeat paracentesis on 1/5 was deferred due to lack of clear pocket.  MRI on 1/4 with nonenhancing L adnexal cystic structure 7 x 4.4 cm with layering, possible endometriomas with hemorrhage.  Also with peritonitis with small volume ascites.  Surgery consulted, has signed off.  ID consulted for probable peritonitis with infected ascites and E coli UTI; treating with Zosyn .  Neurosurgery consulted and thinks she needs externalization of her peritoneal shunt catheter, further investigation and treatment of the source of her abdominal and pelvic abnormalities, and eventual revision of her shunt; he recommends transfer to [facility] and this is in process.  GYN also consulted and confirms endometrioma (benign) with plan for outpatient f/u for replacement of Mirena IUD.    Assessment & Plan Ascites Presented with abdominal pain, initial concern for partial small bowel obstruction; seen by surgery and cleared  Found to have ascites as well as adnexal cystic mass (see below) S/p paracentesis with drainage of 2.2 L fluid Started on Zosyn  for concern for peritonitis/infected ascites, although fluid is not obviously infected at this time, culture NTD Pain control with Dilaudid  and morphine  Due to concern for VP shunt malfunction (see below) +/- infected  ascites, she has been accepted to [facility] and is on the waiting list for transfer Repeat attempt at paracentesis on 1/5 deferred due to no obvious pocket for drainage VP (ventriculoperitoneal) shunt status Complex shunt history, with last revisions in 2015 Followed at [facility] She has no symptoms of a proximal shunt malfunction/occlusion nor meningitis, per neurosurgery Underwent VP shunt reprogramming on 1/4 She appears likely to need an externalization of her peritoneal shunt catheter, further investigation and treatment of the source of her abdominal/pelvic abnormalities, and eventually revision of her shunt Given the difficulties she had with revision of her shunt in 2015, neurosurgery has recommended transfer to [facility] for further evaluation and management of this complex problem She is awaiting bed availability at [facility] Alternatively, if her pain is controlled she may be able to be discharged to [facility] with short-term neurosurgery f/u there with her team PO meds were added today and she is encouraged to try to limit IV meds unless needed for breakthrough Adnexal cyst MRI confirms diagnosis of endometrioma, which is a benign condition Cytology of fluid is pending but not obviously infected as of now Endometrioma is very unlikely to be the underlying etiology of the ascites Tumor markers within normal range Conservative management of endometrioma would include management with NSAIDs and hormonal intervention GYN recommends outpatient replacement of Mirena IUD She is pending transfer to [facility] to allow for multi-disciplinary approach  E. coli UTI (urinary tract infection) ID following  Continuing Zosyn  for now Constipation No recent BM Some of her abdominal distention and pain may be associated with this issue Will order bowel regimen Mother  is aware that narcotics will slow her gut motiity      Consultants: Neurosurgery Surgery GYN ID IR ICM team  Procedures: Paracentesis 1/4 VP Shunt  reprogram 1/4  Antibiotics: Ceftriaxone  x 1 dose Zosyn  1/4-  30 Day Unplanned Readmission Risk Score    Flowsheet Row ED to Hosp-Admission (Current) from 09/02/2024 in [facility] General Surgery  30 Day Unplanned Readmission Risk Score (%) 7.87 Filed at 09/06/2024 0400    This score is the patient's risk of an unplanned readmission within 30 days of being discharged (0 -100%). The score is based on dignosis, age, lab data, medications, orders, and past utilization.   Low:  0-14.9   Medium: 15-21.9   High: 22-29.9   Extreme: 30 and above           Subjective: Feeling some better today.  Still with abdominal distention but she also has not had a BM in a prolonged period of time.  Discussed possible plan for dc once pain control is obtained and then outpatient f/u at [facility] if transfer is not readily available.   Objective: Vitals:   09/06/24 0606 09/06/24 1017  BP: 108/72 114/74  Pulse: (!) 103 (!) 104  Resp: 15 16  Temp: 98.4 F (36.9 C) (!) 97.5 F (36.4 C)  SpO2: (P) 90% 96%    Intake/Output Summary (Last 24 hours) at 09/06/2024 1743 Last data filed at 09/06/2024 0646 Gross per 24 hour  Intake 300 ml  Output --  Net 300 ml   Filed Weights   09/02/24 1814  Weight: 48.5 kg    Exam:  General:  Appears calm and comfortable and is in NAD Eyes:  normal lids, iris ENT:  grossly normal hearing, lips & tongue, mmm Cardiovascular:  RRR. No LE edema.  Respiratory:   CTA bilaterally with no wheezes/rales/rhonchi.  Normal respiratory effort. Abdomen:  soft, generalized TTP, +distention Skin:  no rash or induration seen on limited exam Musculoskeletal:  grossly normal tone BUE/BLE, good ROM, no bony abnormality Psychiatric:  pleasant mood and affect, speech fluent and appropriate Neurologic:  CN 2-12 grossly intact, moves all extremities in coordinated fashion  Data Reviewed: I have reviewed the patient's lab results since admission.  Pertinent labs for today include:   K+  3.4 WBC 7.9 Hgb 9.4 Platelets 595     Family Communication: Mother was present      Code Status: Full Code  Disposition: Status is: Inpatient Remains inpatient appropriate because: ongoing management     Time spent: 50 minutes  Unresulted Labs (From admission, onward)     Start     Ordered   09/07/24 0500  CBC  Tomorrow morning,   R        09/06/24 1743   09/07/24 0500  Basic metabolic panel with GFR  Tomorrow morning,   R        09/06/24 1743             Author: Delon Herald, MD 09/06/2024 5:43 PM  For on call review [email or web address].            "

## 2024-09-06 NOTE — Assessment & Plan Note (Addendum)
 Presented with abdominal pain, initial concern for partial small bowel obstruction; seen by surgery and cleared  Found to have ascites as well as adnexal cystic mass (see below) S/p paracentesis with drainage of 2.2 L fluid Started on Zosyn  for concern for peritonitis/infected ascites, although fluid is not obviously infected at this time, culture NTD Pain control with Dilaudid  and morphine  Due to concern for VP shunt malfunction (see below) +/- infected ascites, she has been accepted to [facility] and is on the waiting list for transfer Repeat attempt at paracentesis on 1/5 deferred due to no obvious pocket for drainage

## 2024-09-06 NOTE — Assessment & Plan Note (Signed)
 No recent BM Some of her abdominal distention and pain may be associated with this issue Will order bowel regimen Mother is aware that narcotics will slow her gut motiity

## 2024-09-07 ENCOUNTER — Inpatient Hospital Stay (HOSPITAL_COMMUNITY)

## 2024-09-07 DIAGNOSIS — B9620 Unspecified Escherichia coli [E. coli] as the cause of diseases classified elsewhere: Secondary | ICD-10-CM | POA: Diagnosis not present

## 2024-09-07 DIAGNOSIS — N838 Other noninflammatory disorders of ovary, fallopian tube and broad ligament: Secondary | ICD-10-CM | POA: Diagnosis present

## 2024-09-07 DIAGNOSIS — N390 Urinary tract infection, site not specified: Secondary | ICD-10-CM | POA: Diagnosis not present

## 2024-09-07 DIAGNOSIS — N949 Unspecified condition associated with female genital organs and menstrual cycle: Secondary | ICD-10-CM | POA: Diagnosis present

## 2024-09-07 DIAGNOSIS — R1084 Generalized abdominal pain: Secondary | ICD-10-CM | POA: Diagnosis not present

## 2024-09-07 DIAGNOSIS — N80122 Deep endometriosis of left ovary: Secondary | ICD-10-CM | POA: Diagnosis not present

## 2024-09-07 DIAGNOSIS — R188 Other ascites: Secondary | ICD-10-CM

## 2024-09-07 DIAGNOSIS — N3000 Acute cystitis without hematuria: Secondary | ICD-10-CM | POA: Diagnosis present

## 2024-09-07 DIAGNOSIS — Z743 Need for continuous supervision: Secondary | ICD-10-CM | POA: Diagnosis not present

## 2024-09-07 DIAGNOSIS — I10 Essential (primary) hypertension: Secondary | ICD-10-CM | POA: Diagnosis not present

## 2024-09-07 LAB — BASIC METABOLIC PANEL WITH GFR
Anion gap: 8 (ref 5–15)
BUN: <5 mg/dL — ABNORMAL LOW (ref 6–20)
CO2: 26 mmol/L (ref 22–32)
Calcium: 8.8 mg/dL — ABNORMAL LOW (ref 8.9–10.3)
Chloride: 103 mmol/L (ref 98–111)
Creatinine, Ser: 0.55 mg/dL (ref 0.44–1.00)
GFR, Estimated: >60 mL/min
Glucose, Bld: 124 mg/dL — ABNORMAL HIGH (ref 70–99)
Potassium: 4 mmol/L (ref 3.5–5.1)
Sodium: 138 mmol/L (ref 135–145)

## 2024-09-07 LAB — CBC
HCT: 29.9 % — ABNORMAL LOW (ref 36.0–46.0)
Hemoglobin: 9.6 g/dL — ABNORMAL LOW (ref 12.0–15.0)
MCH: 25.7 pg — ABNORMAL LOW (ref 26.0–34.0)
MCHC: 32.1 g/dL (ref 30.0–36.0)
MCV: 80.2 fL (ref 80.0–100.0)
Platelets: 579 K/uL — ABNORMAL HIGH (ref 150–400)
RBC: 3.73 MIL/uL — ABNORMAL LOW (ref 3.87–5.11)
RDW: 15.6 % — ABNORMAL HIGH (ref 11.5–15.5)
WBC: 10.1 K/uL (ref 4.0–10.5)
nRBC: 0 % (ref 0.0–0.2)

## 2024-09-07 LAB — BODY FLUID CULTURE W GRAM STAIN
Culture: NO GROWTH
Gram Stain: NONE SEEN

## 2024-09-07 MED ORDER — MORPHINE SULFATE (PF) 4 MG/ML IV SOLN: 4.0000 mg | INTRAVENOUS | Status: AC | PRN

## 2024-09-07 MED ORDER — DOCUSATE SODIUM 100 MG PO CAPS: 100.0000 mg | ORAL_CAPSULE | Freq: Two times a day (BID) | ORAL | Status: AC

## 2024-09-07 MED ORDER — HYDROMORPHONE HCL 1 MG/ML IJ SOLN: 1.0000 mg | INTRAMUSCULAR | Status: AC | PRN

## 2024-09-07 MED ORDER — ALUM & MAG HYDROXIDE-SIMETH 200-200-20 MG/5ML PO SUSP
30.0000 mL | ORAL | Status: DC | PRN
  Administered 2024-09-07 (×2): 30 mL via ORAL
  Filled 2024-09-07 (×2): qty 30

## 2024-09-07 MED ORDER — AMOXICILLIN-POT CLAVULANATE 875-125 MG PO TABS
1.0000 | ORAL_TABLET | Freq: Two times a day (BID) | ORAL | Status: DC
  Administered 2024-09-07 (×2): 1 via ORAL
  Filled 2024-09-07 (×2): qty 1

## 2024-09-07 MED ORDER — ADULT MULTIVITAMIN W/MINERALS CH: 1.0000 | ORAL_TABLET | Freq: Every day | ORAL | Status: AC

## 2024-09-07 MED ORDER — ENSURE PLUS HIGH PROTEIN PO LIQD
237.0000 mL | Freq: Two times a day (BID) | ORAL | Status: DC
  Administered 2024-09-07: 237 mL via ORAL

## 2024-09-07 MED ORDER — CARMEX CLASSIC LIP BALM EX OINT: TOPICAL_OINTMENT | CUTANEOUS | Status: AC | PRN

## 2024-09-07 MED ORDER — AMOXICILLIN-POT CLAVULANATE 875-125 MG PO TABS: 1.0000 | ORAL_TABLET | Freq: Two times a day (BID) | ORAL | Status: AC

## 2024-09-07 MED ORDER — BISACODYL 5 MG PO TBEC: 5.0000 mg | DELAYED_RELEASE_TABLET | Freq: Every day | ORAL | Status: AC | PRN

## 2024-09-07 MED ORDER — ENOXAPARIN SODIUM 30 MG/0.3ML IJ SOSY: 30.0000 mg | PREFILLED_SYRINGE | Freq: Every day | INTRAMUSCULAR | Status: AC

## 2024-09-07 MED ORDER — CALCIUM CARBONATE ANTACID 500 MG PO CHEW: 1.0000 | CHEWABLE_TABLET | Freq: Two times a day (BID) | ORAL | Status: AC

## 2024-09-07 MED ORDER — OXYCODONE HCL 5 MG PO TABS: 5.0000 mg | ORAL_TABLET | ORAL | Status: AC | PRN

## 2024-09-07 MED ORDER — ADULT MULTIVITAMIN W/MINERALS CH
1.0000 | ORAL_TABLET | Freq: Every day | ORAL | Status: DC
  Administered 2024-09-07: 1 via ORAL
  Filled 2024-09-07: qty 1

## 2024-09-07 MED ORDER — POLYETHYLENE GLYCOL 3350 17 G PO PACK: 17.0000 g | PACK | Freq: Every day | ORAL | Status: AC

## 2024-09-07 MED ORDER — ALUM & MAG HYDROXIDE-SIMETH 200-200-20 MG/5ML PO SUSP: 30.0000 mL | ORAL | Status: AC | PRN

## 2024-09-07 MED ORDER — ONDANSETRON HCL 4 MG/2ML IJ SOLN: 4.0000 mg | Freq: Once | INTRAMUSCULAR | Status: AC | PRN

## 2024-09-07 MED ORDER — LIDOCAINE-EPINEPHRINE (PF) 2 %-1:200000 IJ SOLN
INTRAMUSCULAR | Status: AC
  Filled 2024-09-07: qty 20

## 2024-09-07 MED ORDER — ENSURE PLUS HIGH PROTEIN PO LIQD: 237.0000 mL | Freq: Two times a day (BID) | ORAL | Status: AC

## 2024-09-07 NOTE — Plan of Care (Signed)
   Problem: Clinical Measurements: Goal: Diagnostic test results will improve Outcome: Progressing

## 2024-09-07 NOTE — Progress Notes (Signed)
 Initial Nutrition Assessment  DOCUMENTATION CODES:   Not applicable  INTERVENTION:  - Regular diet. - Ensure Plus High Protein po BID, each supplement provides 350 kcal and 20 grams of protein. - Multivitamin with minerals daily. - Monitor weight trends.   - Patient is due for a new weight, ordered.  NUTRITION DIAGNOSIS:   Inadequate oral intake related to acute illness, decreased appetite, early satiety as evidenced by per patient/family report, meal completion < 25%.  GOAL:   Patient will meet greater than or equal to 90% of their needs  MONITOR:   PO intake, Supplement acceptance, Weight trends  REASON FOR ASSESSMENT:   Consult Assessment of nutrition requirement/status  ASSESSMENT:   24yo female with PMH prematurity with hydrocephalus and IVH requiring VP shunt with multiple revisions and insertion of peritoneal catheter who presented on 1/2 with abdominal pain and distention. Admitted for ascites.   Patient reporting her abdomen hurts at time of visit. Patient had just had paracentesis prior to visit that yielded of fluid.   Per EMR, weight stable over the past year. However, weight this admission is the same as weight in December so question if this was copied over.   Patient endorses typically eating around 3 meals a day at home but notes it just depends on if she is hungry or not that day.   Shares that since November, she started developing abdominal pain and this has affected her intake since that time. Of note, patient has a shellfish and fish allergy.   Patient is documented to have consumed only 0-25% of meals yesterday. Did not eat breakfast this morning.  Discussed importance of ordering and trying to eat something at all 3 meals daily. She notes that her abdomen hurts and with the ascites she feels full.   Patient reports being giving an Ensure last night and tolerating it. Agreeable to receive during admission to support oral intake. Encourgaed  patient to consume Ensure whenever offered as it also may be easier to drink these to help meet nutritional needs while eating full meals is difficult.    Medications reviewed and include: Colace, Miralax   Labs reviewed:  -   NUTRITION - FOCUSED PHYSICAL EXAM:  Flowsheet Row Most Recent Value  Orbital Region No depletion  Upper Arm Region No depletion  Thoracic and Lumbar Region No depletion  Buccal Region No depletion  Temple Region No depletion  Clavicle Bone Region Mild depletion  Clavicle and Acromion Bone Region No depletion  Scapular Bone Region Unable to assess  Dorsal Hand No depletion  Patellar Region No depletion  Anterior Thigh Region No depletion  Posterior Calf Region No depletion  Edema (RD Assessment) None  Hair Reviewed  Eyes Reviewed  Mouth Reviewed  Skin Reviewed  Nails Reviewed    Diet Order:   Diet Order             Diet regular Room service appropriate? Yes; Fluid consistency: Thin  Diet effective now                   EDUCATION NEEDS:  Education needs have been addressed  Skin:  Skin Assessment: Reviewed RN Assessment  Last BM:  1/6  Height:  Ht Readings from Last 1 Encounters:  09/02/24 5' (1.524 m)   Weight:  Wt Readings from Last 1 Encounters:  09/02/24 48.5 kg   Ideal Body Weight:  45.45 kg  BMI:  Body mass index is 20.9 kg/m.  Estimated Nutritional Needs:  Kcal:  1450-1700 kcals Protein:  65-75 grams Fluid:  >/= 1.5L    Trude Ned RD, LDN Contact via Secure Chat.

## 2024-09-07 NOTE — Procedures (Signed)
 PROCEDURE SUMMARY:  Successful image-guided therapeutic paracentesis from the right lower abdomen.  Yielded of clear/straw colored fluid.  No immediate complications.  EBL: zero Patient tolerated well.   Please see imaging section of Epic for full dictation.  Thersia LULLA Rummer PA-C 09/07/2024 11:44 AM

## 2024-09-07 NOTE — Progress Notes (Signed)
" ° ° ° °  Patient Name: Crystal Greene           DOB: [birth year]  MRN: [id number]      Admission Date: 09/02/2024  Attending Provider: Arlice Reichert, MD  Primary Diagnosis: Ascites   Level of care: Med-Surg   OVERNIGHT EVENT  I was notified by bedside RN that [facility] has a bed available tonight for Crystal Greene.   I have notified both patient and family at bedside that transportation to [facility] will happen tonight. Both pt and family have no further questions for me. [facility] will be used to transport patient.    Prior to departure, pt was assessed. She is A/Ox4 and is clinically stable to transport. She is on room air and has bilateral clear BS. S1S2 heard. She states current abdominal pain is well controlled. Bowel sounds present, no distention or tenderness.    Vitals:   09/07/24 2200 09/07/24 2223  BP:  (!) 127/106  Pulse:  (!) 114  Resp:  19  Temp: 97.7 F (36.5 C) 97.7 F (36.5 C)  SpO2:  94%     Lavanda Horns, DNP, ACNPC- AG [facility] [place]    "

## 2024-09-07 NOTE — Progress Notes (Signed)
 " PROGRESS NOTE  Crystal Greene  DOB: [birth year]  PCP: Ozell Heron HERO, MD FMW:[id number]  DOA: 09/02/2024  LOS: 4 days  Hospital Day: 6  Subjective: Patient was seen and examined this afternoon. Pleasant young African-American female.  Propped up in bed.  Mother at bedside. Patient underwent paracentesis repeated this morning and had 900 mL drained. Remains afebrile, heart rate 90s and 100s, blood pressure in 120s, breathing room Labs from this morning with hemoglobin 9.6 I called and spoke to Ms. Lori at [facility] transfer center this afternoon.  Patient is in the list for transfer but no bed available yet  Brief narrative: Crystal Greene is a 24 y.o. 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 Over the years, she had laparoscopic revision of ventriculoperitoneal shunt, ovary surgery, umbilical hernia repair, laparoscopic insertion of peritoneal catheter.  She had been having abdominal pain and distention since Thanksgiving which progressed to worsen and she saw her PCP on 12/9.  Underwent CT abdomen pelvis on 12/30. 1/2, with worsening symptoms, patient was brought to the ED  CT abdomen pelvis from 3 days prior on 12/30 resulted and showed distended small bowel with wall thickening and fold thickening, likely due to enteritis but could indicate early or partial small bowel obstruction. Contrast material is not to the colon yet.   Admitted to [facility] General Surgery rule out bowel obstruction  1/3, pelvic ultrasound showed large volume of complex septated pelvic fluid suspicious for peritonitis, multiple pelvic abscess, infected VP shunt 1/3, CT abdomen pelvis was repeated which showed complex multiseptated cystic lesion in the left adnexa measuring 6.2 cm suspicious for malignancy with peritoneal mets.  Increasing large amount of fluid throughout the abdomen  and pelvis with developing peritoneal thickening and possible loculation suggestive of peritonitis or developing abscess. 1/4, paracentesis drained 2.2L clear yellow fluid  1/4, MRI pelvis showed nonenhancing L adnexal cystic structure 7 x 4.4 cm with layering, possible endometriomas with hemorrhage. Also with peritonitis with small volume ascites.   ID was consulted for probable peritonitis with infected ascites and E coli UTI.  Started on IV Zosyn  on  Neurosurgery was consulted.  They recommended she needs externalization of her peritoneal shunt catheter, further investigation and treatment of the source of her abdominal and pelvic abnormalities, and eventual revision of her shunt; recommended transfer to [facility] and this is in process.   Assessment and plan: Progressive abdominal pain New onset ascites Adnexal mass Multiple imagings as above showing left adnexal mass suspicious for benign endometrioma malignancy with peritoneal mets as well as ascites. Seen by general surgery, IR, GYN, ID 1/4, paracentesis drained 2.2L clear yellow fluid  Currently on IV Zosyn  Due to concern for VP shunt malfunction (see below) +/- infected ascites, she was recommended to transfer to [facility].  She has been accepted to [facility] and is on the waiting list for transfer Pain regimen --- Scheduled:  --- PRN: Oral oxycodone  5 mg every 4 hours, IV Dilaudid , IV morphine    VP (ventriculoperitoneal) shunt status Complex shunt history, with last revisions in 2015. Followed at [facility] She has no symptoms of a proximal shunt malfunction/occlusion nor meningitis, per neurosurgery Underwent VP shunt reprogramming on 1/4. She appears likely to need an externalization of her peritoneal shunt catheter, further investigation and treatment of the source of her abdominal/pelvic abnormalities, and eventually revision of her shunt Given the difficulties she had with revision of  her shunt in 2015, neurosurgery has recommended transfer to  [facility] for further evaluation and management of this complex problem. She is awaiting bed availability at [facility]. Alternatively, if her pain is controlled she may be able to be discharged home to follow-up at [facility] neurosurgery  Patient still requiring IV pain meds   Low-position intrauterine device in the lower uterine segment/endocervical region. GYN was also consulted and confirms endometrioma (benign) with plan for outpatient f/u for replacement of Mirena IUD.   E. coli UTI (urinary tract infection) ID following  Continuing Zosyn  for now  Constipation Last BM - last night per patient Some of her abdominal distention and pain may be associated with this issue Currently ordered for Colace twice daily, MiraLAX  daily, Dulcolax as needed Mother is aware that narcotics will slow her gut motiity   Nutrition Status: Nutrition Problem: Inadequate oral intake Etiology: acute illness, decreased appetite, early satiety Signs/Symptoms: per patient/family report, meal completion < 25% Interventions: Refer to RD note for recommendations, Ensure Enlive (each supplement provides 350kcal and 20 grams of protein), MVI   Mobility:   PT Orders:   PT Follow up Rec:     Goals of care   Code Status: Full Code     DVT prophylaxis:  enoxaparin  (LOVENOX ) injection 30 mg Start: 09/03/24 1400 SCDs Start: 09/03/24 1318   Antimicrobials: IV Zosyn  Fluid: NS at 100 mL/h Consultants:  Family Communication: Mother at bedside  Status: Inpatient Level of care:  Med-Surg   Patient is from: Home Needs to continue in-hospital care: Requiring IV pain meds, IV antibiotics Anticipated d/c to: Pending transfer to [facility]    Diet:  Diet Order             Diet regular Room service appropriate? Yes; Fluid consistency: Thin  Diet effective now                   Scheduled Meds:  amoxicillin -clavulanate  1 tablet Oral Q12H   calcium  carbonate  1 tablet Oral BID WC   docusate sodium   100 mg Oral BID    enoxaparin  (LOVENOX ) injection  30 mg Subcutaneous Daily   feeding supplement  237 mL Oral BID BM   multivitamin with minerals  1 tablet Oral Daily   polyethylene glycol  17 g Oral Daily    PRN meds: alum & mag hydroxide-simeth, bisacodyl , HYDROmorphone  (DILAUDID ) injection, lip balm, morphine  injection, ondansetron  (ZOFRAN ) IV, oxyCODONE    Infusions:   sodium chloride  100 mL/hr at 09/07/24 1450    Antimicrobials: Anti-infectives (From admission, onward)    Start     Dose/Rate Route Frequency Ordered Stop   09/07/24 1200  amoxicillin -clavulanate (AUGMENTIN ) 875-125 MG per tablet 1 tablet        1 tablet Oral Every 12 hours 09/07/24 1102     09/07/24 0000  amoxicillin -clavulanate (AUGMENTIN ) 875-125 MG tablet        1 tablet Oral Every 12 hours 09/07/24 1501     09/04/24 1300  piperacillin -tazobactam (ZOSYN ) IVPB 3.375 g  Status:  Discontinued        3.375 g 12.5 mL/hr over 240 Minutes Intravenous Every 8 hours 09/04/24 0946 09/07/24 1102   09/04/24 0600  cefTRIAXone  (ROCEPHIN ) 1 g in sodium chloride  0.9 % 100 mL IVPB  Status:  Discontinued        1 g 200 mL/hr over 30 Minutes Intravenous Every 24 hours 09/03/24 1331 09/04/24 0945   09/03/24 0230  cefTRIAXone  (ROCEPHIN ) 1 g in sodium chloride  0.9 % 100  mL IVPB        1 g 200 mL/hr over 30 Minutes Intravenous  Once 09/03/24 0225 09/03/24 0313       Objective: Vitals:   09/07/24 1351 09/07/24 1355  BP:  113/88  Pulse: (!) 114 (!) 114  Resp:  16  Temp:  98.4 F (36.9 C)  SpO2: 97% 97%    Intake/Output Summary (Last 24 hours) at 09/07/2024 1501 Last data filed at 09/07/2024 1358 Gross per 24 hour  Intake 584.8 ml  Output --  Net 584.8 ml   Filed Weights   09/02/24 1814  Weight: 48.5 kg   Weight change:  Body mass index is 20.9 kg/m.   Physical Exam: General exam: Pleasant, young African-American female Skin: No rashes, lesions or ulcers. HEENT: Atraumatic, normocephalic, no obvious bleeding Lungs: Clear to  auscultation bilaterally,  CVS: S1, S2, no murmur,   GI/Abd: Soft, nontender, nondistended, bowel sound present,   CNS: Alert, awake, able to have limited conversation Psychiatry: Sad affect Extremities: No pedal edema, no calf tenderness,   Data Review: I have personally reviewed the laboratory data and studies available.  F/u labs ordered Unresulted Labs (From admission, onward)    None       Signed, Chapman Rota, MD [facility] 09/07/2024  "

## 2024-09-07 NOTE — Progress Notes (Signed)
 "                                                            [facility] Infectious Diseases Follow Up Note  Patient Identification: Patient Name: Crystal Greene MRN: [id number] Admit Date: 09/02/2024 11:27 PM Age: 24 y.o.Today's Date: 09/07/2024  Reason for Visit: ? Infceted vs non infective ascites   Principal Problem:   Ascites Active Problems:   VP (ventriculoperitoneal) shunt status   Constipation   Adnexal cyst   E. coli UTI (urinary tract infection)  Antibiotics:  Ceftriaxone  1/2-1/3 Zosyn  1/4-  Lines/Hardwares:  Interval Events: Remains afebrile Labs remarkable for BBC 10.1, hemoglobin 9.6, platelets 579   Assessment 24 year old female with prior history of prematurity, IVH, hydrocephalus s/p VP shunt since childhood s/p laparoscopic revision, retinopathy of prematurity, CVA, who presented to the ED on 1/3 for abdominal pain and distention which initially started since Thanksgiving with worsening abdominal distention/pain for several days prior to admission. Found to have   # MRI with nonenhancing left adnexal cystic structure 7.0 x 4.4 cm with internal layering fluid level and 2 rounded foci which demonstrates mild diffusion restriction,  Favored to represent endometriomas with hemorrhage.  Malignant degeneration is not excluded.  Concern for small endometrioma in rt ovary.  -Evaluated by Gynecology, malignancy, PID thought to be less likely.  CA125 39.4.  CA 19-9 1 AFP less than 1.8, CEA 0.8, LDH 85.  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  Recommended paracentesis.  - Neurosurgery following, recommended transfer to [facility] for higher level of care due to difficulties with revision of her shunt in 2015   # Peritonitis/Less likely infected ascites or abscess  - 1/4 s/p ultrasound-guided paracentesis and 2.2 L of peritoneal fluid. Cx with NG in 3 days and no Gram stain.  Fluid with  WBC 738, neutrophilic. Cytology no malignant cells.      # E coli UTI  - has GU symptoms or dysuria and increased frequency - on ceftriaxone > zosyn , completed 5 days tx   Recommendations - Will switch IV Zosyn  to p.o. Augmentin  today  - Mother is asking about getting repeat ultrasound for paracentesis. Informed primary - Patient is in the process of being transferred to [facility] and can keep the augmentin  in the interim although infection seems less likely.   ID available as needed, recall back with questions or concerns  Rest of the management as per the primary team. Thank you for the consult. Please page with pertinent questions or concerns.  ______________________________________________________________________ Subjective patient seen and examined at the bedside.  Persistent abdominal pain, nauseous, reports unable to tolerate p.o. had small bowel movement yesterday.   Vitals BP 106/71 (BP Location: Left Arm)   Pulse 100   Temp 98.4 F (36.9 C) (Oral)   Resp 18   Ht 5' (1.524 m)   Wt 48.5 kg   LMP 08/15/2024 (Exact Date)   SpO2 97%   BMI 20.90 kg/m     Physical Exam Constitutional: Young adult female sitting in the bed, nontoxic-appearing    Comments: HEENT WNL  Cardiovascular:     Rate and Rhythm: Normal rate     Heart sounds:   Pulmonary:     Effort: Pulmonary effort is normal.  Comments:   Abdominal:     Palpations: Abdomen is mildly distended, generalized mild tenderness, guarding present, bowel sounds +    Tenderness:   Musculoskeletal:        General: No swelling or tenderness.   Skin:    Comments: no rashes   Neurological:     General: Awake alert and follows commands  Psychiatric:        Mood and Affect: Mood normal.   Pertinent Microbiology Results for orders placed or performed during the hospital encounter of 09/02/24  Urine Culture     Status: Abnormal   Collection Time: 09/03/24 12:08 AM   Specimen: Urine, Clean Catch  Result Value  Ref Range Status   Specimen Description   Final    URINE, CLEAN CATCH Performed at [facility], [address], [place], [place] [address]    Special Requests   Final    NONE Performed at [facility], [address], [place], [place] [address]    Culture 60,000 COLONIES/mL ESCHERICHIA COLI (A)  Final   Report Status 09/05/2024 FINAL  Final   Organism ID, Bacteria ESCHERICHIA COLI (A)  Final      Susceptibility   Escherichia coli - MIC*    AMPICILLIN 16 INTERMEDIATE Intermediate     CEFAZOLIN (URINE) Value in next row Sensitive      4 SENSITIVEThis is a modified FDA-approved test that has been validated and its performance characteristics determined by the reporting laboratory.  This laboratory is certified under the Clinical Laboratory Improvement Amendments CLIA as qualified to perform high complexity clinical laboratory testing.    CEFEPIME Value in next row Sensitive      4 SENSITIVEThis is a modified FDA-approved test that has been validated and its performance characteristics determined by the reporting laboratory.  This laboratory is certified under the Clinical Laboratory Improvement Amendments CLIA as qualified to perform high complexity clinical laboratory testing.    ERTAPENEM Value in next row Sensitive      4 SENSITIVEThis is a modified FDA-approved test that has been validated and its performance characteristics determined by the reporting laboratory.  This laboratory is certified under the Clinical Laboratory Improvement Amendments CLIA as qualified to perform high complexity clinical laboratory testing.    CEFTRIAXONE  Value in next row Sensitive      4 SENSITIVEThis is a modified FDA-approved test that has been validated and its performance characteristics determined by the reporting laboratory.  This laboratory is certified under the Clinical Laboratory Improvement Amendments CLIA as qualified to perform high complexity clinical  laboratory testing.    CIPROFLOXACIN Value in next row Resistant      4 SENSITIVEThis is a modified FDA-approved test that has been validated and its performance characteristics determined by the reporting laboratory.  This laboratory is certified under the Clinical Laboratory Improvement Amendments CLIA as qualified to perform high complexity clinical laboratory testing.    GENTAMICIN Value in next row Sensitive      4 SENSITIVEThis is a modified FDA-approved test that has been validated and its performance characteristics determined by the reporting laboratory.  This laboratory is certified under the Clinical Laboratory Improvement Amendments CLIA as qualified to perform high complexity clinical laboratory testing.    NITROFURANTOIN Value in next row Sensitive      4 SENSITIVEThis is a modified FDA-approved test that has been validated and its performance characteristics determined by the reporting laboratory.  This laboratory is certified under the Clinical Laboratory Improvement Amendments CLIA as qualified to  perform high complexity clinical laboratory testing.    TRIMETH/SULFA Value in next row Resistant      4 SENSITIVEThis is a modified FDA-approved test that has been validated and its performance characteristics determined by the reporting laboratory.  This laboratory is certified under the Clinical Laboratory Improvement Amendments CLIA as qualified to perform high complexity clinical laboratory testing.    AMPICILLIN/SULBACTAM Value in next row Sensitive      4 SENSITIVEThis is a modified FDA-approved test that has been validated and its performance characteristics determined by the reporting laboratory.  This laboratory is certified under the Clinical Laboratory Improvement Amendments CLIA as qualified to perform high complexity clinical laboratory testing.    PIP/TAZO Value in next row Sensitive      <=4 SENSITIVEThis is a modified FDA-approved test that has been validated and its  performance characteristics determined by the reporting laboratory.  This laboratory is certified under the Clinical Laboratory Improvement Amendments CLIA as qualified to perform high complexity clinical laboratory testing.    MEROPENEM Value in next row Sensitive      <=4 SENSITIVEThis is a modified FDA-approved test that has been validated and its performance characteristics determined by the reporting laboratory.  This laboratory is certified under the Clinical Laboratory Improvement Amendments CLIA as qualified to perform high complexity clinical laboratory testing.    * 60,000 COLONIES/mL ESCHERICHIA COLI  Body fluid culture w Gram Stain     Status: None   Collection Time: 09/04/24 12:48 PM   Specimen: PATH Cytology Peritoneal fluid  Result Value Ref Range Status   Specimen Description   Final    PERITONEAL Performed at [facility], [address] [place], [place] [address]    Special Requests   Final    NONE Performed at [facility], [address] [place], [place] [address]    Gram Stain NO WBC SEEN NO ORGANISMS SEEN   Final   Culture   Final    NO GROWTH 3 DAYS Performed at [facility], [address] [place], [place] [address]    Report Status 09/07/2024 FINAL  Final   Pertinent Lab.    Latest Ref Rng & Units 09/07/2024    5:12 AM 09/06/2024    8:12 AM 09/04/2024    5:13 AM  CBC  WBC 4.0 - 10.5 K/uL 10.1  7.9  13.0   Hemoglobin 12.0 - 15.0 g/dL 9.6  9.4  89.7   Hematocrit 36.0 - 46.0 % 29.9  29.4  31.9   Platelets 150 - 400 K/uL 579  595  594       Latest Ref Rng & Units 09/07/2024    5:12 AM 09/06/2024    8:12 AM 09/04/2024    5:13 AM  CMP  Glucose 70 - 99 mg/dL 875  83  82   BUN 6 - 20 mg/dL <5  <5  8   Creatinine 0.44 - 1.00 mg/dL 9.44  9.43  9.37   Sodium 135 - 145 mmol/L 138  135  137   Potassium 3.5 - 5.1 mmol/L 4.0  3.4  3.9   Chloride 98 - 111 mmol/L 103  101  101   CO2 22 - 32 mmol/L 26  25  20    Calcium  8.9 -  10.3 mg/dL 8.8  8.9  9.5   Total Protein 6.5 - 8.1 g/dL  6.4  7.6   Total Bilirubin 0.0 - 1.2 mg/dL  0.4  0.4   Alkaline Phos  38 - 126 U/L  104  108   AST 15 - 41 U/L  28  23   ALT 0 - 44 U/L  27  14      Pertinent Imaging today Plain films and CT images have been personally visualized and interpreted; radiology reports have been reviewed. Decision making incorporated into the Impression /   US  Abdomen Limited Result Date: 09/06/2024 CLINICAL DATA:  Pt with history of left adnexal cystic structure/endometrioma/abdominal pain, urinary tract infection, ascites, VP shunt; status post diagnostic and therapeutic paracentesis on 09/04/24 ; request received for additional therapeutic paracentesis. EXAM: LIMITED ABDOMEN ULTRASOUND FOR ASCITES TECHNIQUE: Limited ultrasound survey for ascites was performed in all four abdominal quadrants. COMPARISON:  US  paracentesis 09/04/24; MRI pelvis 09/04/24 FINDINGS: Limited US  abdomen in all four quadrants reveals small amount of ascites right lower quadrant /deep pelvic regions which is complex /multiseptated/loculated with bowel loops in close proximity. Following discussion with patient/family decision made to cancel procedure. IMPRESSION: Limited US  abdomen in all four quadrants reveals small amount of ascites right lower quadrant /deep pelvic regions which is complex /multiseptated/loculated with bowel loops in close proximity. Following discussion with patient/family decision made to cancel procedure. Performed by: Franky Rakers, PA-C Electronically Signed   By: Marcey Moan M.D.   On: 09/06/2024 10:08    I personally spent a total of 50 minutes in the care of the patient today including preparing to see the patient, getting/reviewing separately obtained history, performing a medically appropriate exam/evaluation, counseling and educating, placing orders, referring and communicating with other health care professionals, documenting clinical information in the EHR,  independently interpreting results, communicating results, and coordinating care.    Electronically signed by:   Annalee Orem, MD Infectious Disease Physician [facility] Pager: [phone]  "

## 2024-09-08 NOTE — Discharge Summary (Signed)
 " PROGRESS NOTE  Crystal Greene  DOB: [birth year]  PCP: Ozell Heron HERO, MD FMW:[id number]  DOA: 09/02/2024  LOS: 4 days  Hospital Day: 6  Subjective: Patient was seen and examined on the afternoon of 1/7. Pleasant young African-American female.  Propped up in bed.  Mother at bedside. I called and spoke to Ms. Lori at [facility] transfer center this afternoon.   Bed was available in the night and patient was transferred to [facility]  Brief narrative: Crystal Greene is a 24 y.o. 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 Over the years, she had laparoscopic revision of ventriculoperitoneal shunt, ovary surgery, umbilical hernia repair, laparoscopic insertion of peritoneal catheter.  She had been having abdominal pain and distention since Thanksgiving which progressed to worsen and she saw her PCP on 12/9.  Underwent CT abdomen pelvis on 12/30. 1/2, with worsening symptoms, patient was brought to the ED  CT abdomen pelvis from 3 days prior on 12/30 resulted and showed distended small bowel with wall thickening and fold thickening, likely due to enteritis but could indicate early or partial small bowel obstruction. Contrast material is not to the colon yet.   Admitted to [facility] General Surgery rule out bowel obstruction  1/3, pelvic ultrasound showed large volume of complex septated pelvic fluid suspicious for peritonitis, multiple pelvic abscess, infected VP shunt 1/3, CT abdomen pelvis was repeated which showed complex multiseptated cystic lesion in the left adnexa measuring 6.2 cm suspicious for malignancy with peritoneal mets.  Increasing large amount of fluid throughout the abdomen and pelvis with developing peritoneal thickening and possible loculation suggestive of peritonitis or developing abscess. 1/4, paracentesis drained 2.2L clear yellow fluid  1/4, MRI  pelvis showed nonenhancing L adnexal cystic structure 7 x 4.4 cm with layering, possible endometriomas with hemorrhage. Also with peritonitis with small volume ascites.   ID was consulted for probable peritonitis with infected ascites and E coli UTI.  Started on IV Zosyn  on  Neurosurgery was consulted.  They recommended she needs externalization of her peritoneal shunt catheter, further investigation and treatment of the source of her abdominal and pelvic abnormalities, and eventual revision of her shunt; recommended transfer to [facility] and this is in process.   Hospital course: Progressive abdominal pain New onset ascites Adnexal mass Multiple imagings as above showing left adnexal mass suspicious for benign endometrioma malignancy with peritoneal mets as well as ascites. Seen by general surgery, IR, GYN, ID 1/4, paracentesis drained 2.2L clear yellow fluid  Currently on IV Zosyn  Due to concern for VP shunt malfunction (see below) +/- infected ascites, she was recommended to transfer to [facility].  She has been accepted to [facility] and is on the waiting list for transfer Pain regimen --- Scheduled:  --- PRN: Oral oxycodone  5 mg every 4 hours, IV Dilaudid , IV morphine    VP (ventriculoperitoneal) shunt status Complex shunt history, with last revisions in 2015. Followed at [facility] She has no symptoms of a proximal shunt malfunction/occlusion nor meningitis, per neurosurgery Underwent VP shunt reprogramming on 1/4. She appears likely to need an externalization of her peritoneal shunt catheter, further investigation and treatment of the source of her abdominal/pelvic abnormalities, and eventually revision of her shunt Given the difficulties she had with revision of her shunt in 2015, neurosurgery has recommended transfer to [facility] for further evaluation and management of this complex problem. She is awaiting bed availability at [facility] Alternatively, if  her pain is controlled she may be able to be discharged home  to follow-up at [facility] neurosurgery  Patient still requiring IV pain meds   Low-position intrauterine device in the lower uterine segment/endocervical region. GYN was also consulted and confirms endometrioma (benign) with plan for outpatient f/u for replacement of Mirena IUD.   E. coli UTI (urinary tract infection) ID following  Continuing Zosyn  for now  Constipation Last BM - last night per patient Some of her abdominal distention and pain may be associated with this issue Currently ordered for Colace twice daily, MiraLAX  daily, Dulcolax as needed Mother is aware that narcotics will slow her gut motiity   Nutrition Status: Nutrition Problem: Inadequate oral intake Etiology: acute illness, decreased appetite, early satiety Signs/Symptoms: per patient/family report, meal completion < 25% Interventions: Refer to RD note for recommendations, Ensure Enlive (each supplement provides 350kcal and 20 grams of protein), MVI   Mobility:   PT Orders:   PT Follow up Rec:     Goals of care   Code Status: Prior   Diet:  Diet Order     None       Nutritional status:  Body mass index is 22.48 kg/m.  Nutrition Problem: Inadequate oral intake Etiology: acute illness, decreased appetite, early satiety Signs/Symptoms: per patient/family report, meal completion < 25%  Wounds:  -    Discharge Medications:   Allergies as of 09/07/2024       Reactions   Shellfish Allergy Swelling   Throat swells   Fish Allergy Swelling   Angioedema after eating tilapia - positive skin prick testing   Other Swelling   Positive skin prick testing to tree nuts.         Medication List     TAKE these medications    alum & mag hydroxide-simeth 200-200-20 MG/5ML suspension Commonly known as: MAALOX/MYLANTA Take 30 mLs by mouth every 4 (four) hours as needed for indigestion or heartburn.   amoxicillin -clavulanate 875-125 MG tablet Commonly known as: AUGMENTIN  Take 1 tablet by mouth every  12 (twelve) hours.   bisacodyl  5 MG EC tablet Commonly known as: DULCOLAX Take 1 tablet (5 mg total) by mouth daily as needed for moderate constipation.   calcium  carbonate 500 MG chewable tablet Commonly known as: TUMS - dosed in mg elemental calcium  Chew 1 tablet (200 mg of elemental calcium  total) by mouth 2 (two) times daily with a meal.   docusate sodium  100 MG capsule Commonly known as: COLACE Take 1 capsule (100 mg total) by mouth 2 (two) times daily.   enoxaparin  30 MG/0.3ML injection Commonly known as: LOVENOX  Inject 0.3 mLs (30 mg total) into the skin daily.   EPINEPHrine  0.3 mg/0.3 mL Soaj injection Commonly known as: EpiPen  2-Pak INJECT 0.3MLS INTO THE MUSCLE ONCE in case of anaphylaxis   feeding supplement Liqd Take 237 mLs by mouth 2 (two) times daily between meals.   HYDROmorphone  1 MG/ML injection Commonly known as: DILAUDID  Inject 1 mL (1 mg total) into the vein every hour as needed for moderate pain (pain score 4-6).   ibuprofen  600 MG tablet Commonly known as: ADVIL  Take 1 tablet (600 mg total) by mouth every 6 (six) hours as needed.   lip balm ointment Apply topically as needed.   morphine  (PF) 4 MG/ML injection Inject 1 mL (4 mg total) into the vein every 4 (four) hours as needed for severe pain (pain score 7-10).   multivitamin with minerals Tabs tablet Take 1 tablet by mouth daily.  ondansetron  4 MG/2ML Soln injection Commonly known as: ZOFRAN  Inject 2 mLs (4 mg total) into the vein once as needed for nausea or vomiting.   oxyCODONE  5 MG immediate release tablet Commonly known as: Oxy IR/ROXICODONE  Take 1 tablet (5 mg total) by mouth every 4 (four) hours as needed for severe pain (pain score 7-10).   polyethylene glycol 17 g packet Commonly known as: MIRALAX  / GLYCOLAX  Take 17 g by mouth daily.         Follow ups:    Follow-up Information     Ozell Heron HERO, MD Follow up.   Specialty: Family Medicine Contact  information: [address] [place] [address] [phone]                 Discharge Instructions:    Discharge Exam:   Vitals:   09/07/24 1555 09/07/24 1854 09/07/24 2200 09/07/24 2223  BP: 115/83   (!) 127/106  Pulse: 98   (!) 114  Resp: 16   19  Temp: 98.2 F (36.8 C)  97.7 F (36.5 C) 97.7 F (36.5 C)  TempSrc: Axillary  Oral Oral  SpO2: 95%   94%  Weight:  52.2 kg    Height:        Body mass index is 22.48 kg/m.   General exam: Pleasant, young African-American female Skin: No rashes, lesions or ulcers. HEENT: Atraumatic, normocephalic, no obvious bleeding Lungs: Clear to auscultation bilaterally,  CVS: S1, S2, no murmur,   GI/Abd: Soft, nontender, nondistended, bowel sound present,   CNS: Alert, awake, able to have limited conversation Psychiatry: Sad affect Extremities: No pedal edema, no calf tenderness,    The results of significant diagnostics from this hospitalization (including imaging, microbiology, ancillary and laboratory) are listed below for reference.    Procedures and Diagnostic Studies:   CT ABDOMEN PELVIS W CONTRAST Result Date: 09/03/2024 EXAM: CT ABDOMEN AND PELVIS WITH CONTRAST 09/03/2024 10:28:08 PM TECHNIQUE: CT of the abdomen and pelvis was performed with the administration of intravenous contrast. Multiplanar reformatted images are provided for review. Automated exposure control, iterative reconstruction, and/or weight-based adjustment of the mA/kV was utilized to reduce the radiation dose to as low as reasonably achievable. 75 mL of iohexol  (OMNIPAQUE ) 300 MG/ML solution was administered. COMPARISON: Ultrasound pelvis 09/03/2024, abdominal radiograph 09/03/2024, CT abdomen and pelvis 08/30/2024. CLINICAL HISTORY: Adnexal mass. FINDINGS: LOWER CHEST: Mild atelectasis in the lung bases. Developing pleural thickening with possible developing loculation of some of the fluid. LIVER: Low attenuation changes in the left lobe of the  liver probably represent areas of focal fatty infiltration. No change since prior study. GALLBLADDER AND BILE DUCTS: Gallbladder, bile ducts, and pancreas are unremarkable. SPLEEN: Spleen is normal. PANCREAS: Gallbladder, bile ducts, and pancreas are unremarkable. ADRENAL GLANDS: Adrenal glands are normal. KIDNEYS, URETERS AND BLADDER: Kidneys are normal. No stones in the kidneys or ureters. No hydronephrosis. No perinephric or periureteral stranding. Urinary bladder is unremarkable. GI AND BOWEL: The stomach, small bowel, and colon are not abnormally distended. Contrast material flows through to the rectum without evidence of obstruction. Appendix is not identified. PERITONEUM AND RETROPERITONEUM: Tubing demonstrated in the anterior chest/abdominal wall extending into the abdomen and pelvis consistent with ventriculoperitoneal shunt tubing. Large amount of fluid throughout the abdomen and pelvis, increasing since previous study. This may indicate peritonitis or possibly developing abscess. No free air. VASCULATURE: Normal caliber abdominal aorta. No aneurysm or dissection. LYMPH NODES: No lymphadenopathy. REPRODUCTIVE ORGANS: Nodular myometrial appearance consistent with fibroid. Intrauterine device is  demonstrated in the lower uterine segment/endocervical region. Complex multiseptated cystic lesion in the left adnexa, measuring 6.2 cm in diameter. The left adnexal mass was seen on prior CT but not visualized at ultrasound. MRI is recommended for further evaluation. Malignancy with peritoneal metastatic disease is not excluded. BONES AND SOFT TISSUES: No acute osseous abnormality. No focal soft tissue abnormality. Tubing demonstrated in the anterior chest/abdominal wall extending into the abdomen and pelvis consistent with ventriculoperitoneal shunt tubing. IMPRESSION: 1. Complex multiseptated cystic lesion in the left adnexa measuring 6.2 cm, seen on prior CT but not visualized at ultrasound. MRI is recommended  for further evaluation. Malignancy with peritoneal metastatic disease is not excluded. 2. Large amount of fluid throughout the abdomen and pelvis, increasing since previous study, with developing peritoneal thickening and possible loculation, suggestive of peritonitis or developing abscess. Electronically signed by: Elsie Gravely MD 09/03/2024 10:44 PM EST RP Workstation: [id number]   US  PELVIC COMPLETE WITH TRANSVAGINAL Result Date: 09/03/2024 EXAM: US  Pelvis, Complete Transvaginal and Transabdominal without Doppler TECHNIQUE: Transabdominal and transvaginal pelvic duplex ultrasound using B-mode/gray scaled imaging without Doppler spectral analysis and color flow was obtained. COMPARISON: CT abdomen and pelvis from 08/30/2024. CLINICAL HISTORY: [id number] Ovarian mass, left [id number] Ovarian mass, left. FINDINGS: UTERUS: Anteverted uterus measures 7.6 x 3.3 x 4.2 cm with a volume of 55.3 cc. Intramural fundal fibroid measures 1.3 x 1.2 x 2.0 cm. ENDOMETRIAL STRIPE: The endometrium measures 11 mm in thickness. IUD may be malpositioned within the lower uterine segment and cervical region. This appears similar to the CT findings from 08/30/2024. RIGHT OVARY: The right ovary is not visualized and is obscured by complex fluid and overlying bowel gas. LEFT OVARY: The left ovary is also not visualized and is obscured by complex fluid and overlying bowel gas. FREE FLUID: A large volume of fluid is identified within the pelvis, there are multiple areas of low-level internal echoes and multiple septations. On the CT from 08/30/2024 corresponding multifocal loculated fluid collections noted with a Ventriculoperitoneal shunt is in place. IMPRESSION: 1. Large volume of complex, septated pelvic fluid with low-level internal echoes, similar to prior CT, with the ovaries not visualized due to overlying fluid and bowel gas; The most likely differential diagnosis based on these findings includes peritonitis with multiple pelvic  abscesses (possibly due to infected ventriculoperitoneal shunt tubing,) malignancy (e.g., ovarian cancer with ascites and peritoneal carcinomatosis) and pelvic inflammatory disease (PID) with tubo-ovarian abscesses or pyosalpinx. Appropriate follow-up guidelines include correlation with clinical symptoms and laboratory findings (e.g., CA-125), further imaging such as a dedicated pelvic MRI for better characterization of the adnexal regions and the complex fluid collections, and consideration of fluid aspiration. 2. The IUD is malpositioned within the lower uterine segment and cervix. Electronically signed by: Waddell Calk MD 09/03/2024 09:18 AM EST RP Workstation: [id number]   DG Abdomen 1 View Result Date: 09/03/2024 EXAM: 1 VIEW XRAY OF THE ABDOMEN 09/03/2024 02:10:00 AM COMPARISON: 06/22/2022 CLINICAL HISTORY: distention, concern for early obstruction FINDINGS: BOWEL: Nonobstructive bowel gas pattern. Oral contrast material noted throughout the colon. SOFT TISSUES: IUD noted in the lower pelvis. VP shunt catheter terminates in the lower abdomen. No abnormal calcifications. BONES: No acute fracture. IMPRESSION: 1. No acute findings. Electronically signed by: Franky Crease MD 09/03/2024 02:21 AM EST RP Workstation: [id number]     Labs:   Basic Metabolic Panel: Recent Labs  Lab 09/02/24 1817 09/03/24 0518 09/03/24 1341 09/04/24 0513 09/06/24 0812 09/07/24 0512  NA 136 135  --  137 135 138  K 3.6 3.6  --  3.9 3.4* 4.0  CL 97* 101  --  101 101 103  CO2 24 23  --  20* 25 26  GLUCOSE 83 87  --  82 83 124*  BUN 16 11  --  8 <5* <5*  CREATININE 0.67 0.57 0.69 0.62 0.56 0.55  CALCIUM  9.8 9.0  --  9.5 8.9 8.8*   GFR Estimated Creatinine Clearance: 79.2 mL/min (by C-G formula based on SCr of 0.55 mg/dL). Liver Function Tests: Recent Labs  Lab 09/02/24 1817 09/03/24 0518 09/04/24 0513 09/06/24 0812  AST 11* 11* 23 28  ALT 9 9 14 27   ALKPHOS 108 82 108 104  BILITOT 0.4 0.3 0.4 0.4  PROT  8.0 6.9 7.6 6.4*  ALBUMIN 3.5 2.8* 3.0* 2.7*   Recent Labs  Lab 09/02/24 1817  LIPASE 28   No results for input(s): AMMONIA in the last 168 hours. Coagulation profile No results for input(s): INR, PROTIME in the last 168 hours.  CBC: Recent Labs  Lab 09/03/24 0518 09/03/24 1341 09/04/24 0513 09/06/24 0812 09/07/24 0512  WBC 10.7* 11.2* 13.0* 7.9 10.1  HGB 9.0* 10.2* 10.2* 9.4* 9.6*  HCT 26.2* 31.4* 31.9* 29.4* 29.9*  MCV 78.4* 79.5* 80.6 79.7* 80.2  PLT 500* 610* 594* 595* 579*   Cardiac Enzymes: No results for input(s): CKTOTAL, CKMB, CKMBINDEX, TROPONINI in the last 168 hours. BNP: Invalid input(s): POCBNP CBG: No results for input(s): GLUCAP in the last 168 hours. D-Dimer No results for input(s): DDIMER in the last 72 hours. Hgb A1c No results for input(s): HGBA1C in the last 72 hours. Lipid Profile No results for input(s): CHOL, HDL, LDLCALC, TRIG, CHOLHDL, LDLDIRECT in the last 72 hours. Thyroid function studies No results for input(s): TSH, T4TOTAL, T3FREE, THYROIDAB in the last 72 hours.  Invalid input(s): FREET3 Anemia work up No results for input(s): VITAMINB12, FOLATE, FERRITIN, TIBC, IRON, RETICCTPCT in the last 72 hours. Microbiology Recent Results (from the past 240 hours)  Urine Culture     Status: Abnormal   Collection Time: 09/03/24 12:08 AM   Specimen: Urine, Clean Catch  Result Value Ref Range Status   Specimen Description   Final    URINE, CLEAN CATCH Performed at [facility], [address] [place], [place] [address]    Special Requests   Final    NONE Performed at [facility], [address] [place], [place] [address]    Culture 60,000 COLONIES/mL ESCHERICHIA COLI (A)  Final   Report Status 09/05/2024 FINAL  Final   Organism ID, Bacteria ESCHERICHIA COLI (A)  Final      Susceptibility   Escherichia coli - MIC*    AMPICILLIN 16  INTERMEDIATE Intermediate     CEFAZOLIN (URINE) Value in next row Sensitive      4 SENSITIVEThis is a modified FDA-approved test that has been validated and its performance characteristics determined by the reporting laboratory.  This laboratory is certified under the Clinical Laboratory Improvement Amendments CLIA as qualified to perform high complexity clinical laboratory testing.    CEFEPIME Value in next row Sensitive      4 SENSITIVEThis is a modified FDA-approved test that has been validated and its performance characteristics determined by the reporting laboratory.  This laboratory is certified under the Clinical Laboratory Improvement Amendments CLIA as qualified to perform high complexity clinical laboratory testing.    ERTAPENEM Value in next row Sensitive      4 SENSITIVEThis is a modified FDA-approved test  that has been validated and its performance characteristics determined by the reporting laboratory.  This laboratory is certified under the Clinical Laboratory Improvement Amendments CLIA as qualified to perform high complexity clinical laboratory testing.    CEFTRIAXONE  Value in next row Sensitive      4 SENSITIVEThis is a modified FDA-approved test that has been validated and its performance characteristics determined by the reporting laboratory.  This laboratory is certified under the Clinical Laboratory Improvement Amendments CLIA as qualified to perform high complexity clinical laboratory testing.    CIPROFLOXACIN Value in next row Resistant      4 SENSITIVEThis is a modified FDA-approved test that has been validated and its performance characteristics determined by the reporting laboratory.  This laboratory is certified under the Clinical Laboratory Improvement Amendments CLIA as qualified to perform high complexity clinical laboratory testing.    GENTAMICIN Value in next row Sensitive      4 SENSITIVEThis is a modified FDA-approved test that has been validated and its performance  characteristics determined by the reporting laboratory.  This laboratory is certified under the Clinical Laboratory Improvement Amendments CLIA as qualified to perform high complexity clinical laboratory testing.    NITROFURANTOIN Value in next row Sensitive      4 SENSITIVEThis is a modified FDA-approved test that has been validated and its performance characteristics determined by the reporting laboratory.  This laboratory is certified under the Clinical Laboratory Improvement Amendments CLIA as qualified to perform high complexity clinical laboratory testing.    TRIMETH/SULFA Value in next row Resistant      4 SENSITIVEThis is a modified FDA-approved test that has been validated and its performance characteristics determined by the reporting laboratory.  This laboratory is certified under the Clinical Laboratory Improvement Amendments CLIA as qualified to perform high complexity clinical laboratory testing.    AMPICILLIN/SULBACTAM Value in next row Sensitive      4 SENSITIVEThis is a modified FDA-approved test that has been validated and its performance characteristics determined by the reporting laboratory.  This laboratory is certified under the Clinical Laboratory Improvement Amendments CLIA as qualified to perform high complexity clinical laboratory testing.    PIP/TAZO Value in next row Sensitive      <=4 SENSITIVEThis is a modified FDA-approved test that has been validated and its performance characteristics determined by the reporting laboratory.  This laboratory is certified under the Clinical Laboratory Improvement Amendments CLIA as qualified to perform high complexity clinical laboratory testing.    MEROPENEM Value in next row Sensitive      <=4 SENSITIVEThis is a modified FDA-approved test that has been validated and its performance characteristics determined by the reporting laboratory.  This laboratory is certified under the Clinical Laboratory Improvement Amendments CLIA as qualified to  perform high complexity clinical laboratory testing.    * 60,000 COLONIES/mL ESCHERICHIA COLI  Body fluid culture w Gram Stain     Status: None   Collection Time: 09/04/24 12:48 PM   Specimen: PATH Cytology Peritoneal fluid  Result Value Ref Range Status   Specimen Description   Final    PERITONEAL Performed at [facility], [address] [place], [place] [address]    Special Requests   Final    NONE Performed at [facility], [address] [place], [place] [address]    Gram Stain NO WBC SEEN NO ORGANISMS SEEN   Final   Culture   Final    NO GROWTH 3 DAYS Performed at [facility], [address] [place], [place] [address]    Report Status 09/07/2024 FINAL  Final   Signed: Chapman Lajean Boese  [facility] 09/08/2024, 1:54 PM   "

## 2024-09-14 ENCOUNTER — Ambulatory Visit: Payer: Self-pay | Admitting: Obstetrics & Gynecology

## 2024-09-14 ENCOUNTER — Ambulatory Visit: Admitting: Physician Assistant
